# Patient Record
Sex: Female | Born: 1985 | Hispanic: Yes | Marital: Married | Smoking: Never smoker
Health system: Southern US, Community
[De-identification: ages and names within clinical notes are randomized; demographics above are authoritative.]

## PROBLEM LIST (undated history)

## (undated) ENCOUNTER — Inpatient Hospital Stay (HOSPITAL_COMMUNITY): Payer: Self-pay

## (undated) DIAGNOSIS — O009 Unspecified ectopic pregnancy without intrauterine pregnancy: Secondary | ICD-10-CM

## (undated) DIAGNOSIS — Z789 Other specified health status: Secondary | ICD-10-CM

## (undated) HISTORY — PX: NO PAST SURGERIES: SHX2092

---

## 2008-01-16 ENCOUNTER — Ambulatory Visit: Payer: Self-pay | Admitting: Family Medicine

## 2008-01-16 DIAGNOSIS — N946 Dysmenorrhea, unspecified: Secondary | ICD-10-CM

## 2008-01-16 DIAGNOSIS — F411 Generalized anxiety disorder: Secondary | ICD-10-CM | POA: Insufficient documentation

## 2008-01-16 LAB — CONVERTED CEMR LAB: Beta hcg, urine, semiquantitative: NEGATIVE

## 2008-03-05 ENCOUNTER — Ambulatory Visit: Payer: Self-pay | Admitting: Family Medicine

## 2008-03-05 DIAGNOSIS — R5381 Other malaise: Secondary | ICD-10-CM

## 2008-03-05 DIAGNOSIS — R5383 Other fatigue: Secondary | ICD-10-CM

## 2008-03-05 LAB — CONVERTED CEMR LAB
Calcium: 9.1 mg/dL (ref 8.4–10.5)
Chlamydia, Swab/Urine, PCR: NEGATIVE
Chloride: 106 meq/L (ref 96–112)
Cholesterol: 130 mg/dL (ref 0–200)
Creatinine, Ser: 0.56 mg/dL (ref 0.40–1.20)
GC Probe Amp, Urine: NEGATIVE
MCHC: 32.5 g/dL (ref 30.0–36.0)
VLDL: 17 mg/dL (ref 0–40)
WBC: 6.1 10*3/uL (ref 4.0–10.5)

## 2009-02-11 ENCOUNTER — Emergency Department (HOSPITAL_COMMUNITY): Admission: EM | Admit: 2009-02-11 | Discharge: 2009-02-11 | Payer: Self-pay | Admitting: Emergency Medicine

## 2009-02-11 ENCOUNTER — Emergency Department (HOSPITAL_COMMUNITY): Admission: EM | Admit: 2009-02-11 | Discharge: 2009-02-11 | Payer: Self-pay | Admitting: Family Medicine

## 2009-05-04 ENCOUNTER — Telehealth: Payer: Self-pay | Admitting: Family Medicine

## 2009-05-04 ENCOUNTER — Encounter: Payer: Self-pay | Admitting: Family Medicine

## 2009-05-04 ENCOUNTER — Ambulatory Visit: Payer: Self-pay | Admitting: Family Medicine

## 2009-05-04 DIAGNOSIS — M549 Dorsalgia, unspecified: Secondary | ICD-10-CM | POA: Insufficient documentation

## 2009-06-05 ENCOUNTER — Emergency Department (HOSPITAL_COMMUNITY): Admission: EM | Admit: 2009-06-05 | Discharge: 2009-06-05 | Payer: Self-pay | Admitting: Emergency Medicine

## 2010-01-03 ENCOUNTER — Encounter: Payer: Self-pay | Admitting: Family Medicine

## 2010-01-03 ENCOUNTER — Ambulatory Visit: Payer: Self-pay | Admitting: Family Medicine

## 2010-01-03 DIAGNOSIS — R42 Dizziness and giddiness: Secondary | ICD-10-CM

## 2010-04-19 NOTE — Letter (Signed)
Summary: Out of Work  [facility]  [address]   [place], [place] [address]   Phone: [phone]  Fax: [phone]    January 03, 2010   Employee:  Karianne ESCOBAR    To Whom It May Concern:   For Medical reasons, please excuse the above named employee from work for the following dates:  Start:   January 03, 2010  End:   May return to work on Wed, January 05, 2010.   If you need additional information, please feel free to contact our office.         Sincerely,    Cat Ta MD

## 2010-04-19 NOTE — Letter (Signed)
Summary: Out of Work  [facility]  [address]   [place], [place] [address]   Phone: [phone]  Fax: [phone]    May 04, 2009   Employee:  Annaly ESCOBAR    To Whom It May Concern:   For Medical reasons, please excuse the above named employee from work for the following dates:  Start: 05/03/09  Back to work on 05/05/09    If you need additional information, please feel free to contact our office.         Sincerely,    Bobby Rumpf  MD

## 2010-04-19 NOTE — Assessment & Plan Note (Signed)
Summary: back pain/[place]   Vital Signs:  Patient profile:   25 year old female Weight:      105 pounds Temp:     98.5 degrees F oral Pulse rate:   76 / minute BP sitting:   103 / 62  (right arm) Cuff size:   regular  Vitals Entered By: Tessie Fass CMA (May 04, 2009 11:24 AM) CC: back pain x 1 day Is Patient Diabetic? No Pain Assessment Patient in pain? yes     Location: back Intensity: 9   CC:  back pain x 1 day.  History of Present Illness: 1) Back pain: x 2 months. Gradual onset. Pain feels like - "it hurts". Worse yesterday; had to stay home from work and her boss was very upset, so she is here today to be seen. 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 Pain is in lower back bilaterally and at bilateral shoulders at times. Intermittent, worse after work. Ibuprofen help sometimes. Denies bladder or bowel incontinence, trauma, numbness / tingling, shooting pains, leg weakness, dysuria.   Habits & Providers  Alcohol-Tobacco-Diet     Tobacco Status: never  Current Medications (verified): 1)  Sprintec 28 0.25-35 Mg-Mcg Tabs (Norgestimate-Eth Estradiol) .... Take 1 By Mouth Every Day To Help With Pain With Your Periods 2)  Flexeril 5 Mg Tabs (Cyclobenzaprine Hcl) .... One Tab At Night As Needed Back Pain (Instructions in Spanish) 3)  Ibuprofen 800 Mg Tabs (Ibuprofen) .... One Tab By Mouth Three Times A Day As Needed For Back Pain (Instructions in Spanish Please)  Allergies (verified): No Known Drug Allergies  Physical Exam  General:  Well-developed,well-nourished,in no acute distress; alert,appropriate and cooperative throughout examination Lungs:  Normal respiratory effort, chest expands symmetrically. Lungs are clear to auscultation, no crackles or wheezes. Heart:  Normal rate and regular rhythm. S1 and S2 normal without gallop, murmur, click, rub or other extra sounds. Abdomen:  soft, non tender, non distended, + BS, no CVA tenderness Msk:  full  ROM at lumbar spine with flexion (though pain at lumbar spine, SI joints with full flexion), extension, lateral bending, rotation. +ve FABER bilaterally w/ pain at SI joints -ve SLR bilaterally  normal gait no scoliosis  R trapezius w/ spasm; bilateral shoulder exam o/w normal    Pulses:  2+ pedal  Neurologic:  alert & oriented X3, strength normal in all extremities, sensation intact to light touch, sensation intact to pinprick, gait normal, and DTRs symmetrical and normal.     Impression & Recommendations:  Problem # 1:  BACK PAIN, CHRONIC, INTERMITTENT (ICD-724.5) Assessment New  Likely secondary to poor lifting and bending technique at work; overuse at work. Print out in Spanish regarding proper technique. Exercises provided. Flexeril + Ibuprofen as below. No red flags on history or exam. Follow up with PCP in 3-4 weeks.  Her updated medication list for this problem includes:    Flexeril 5 Mg Tabs (Cyclobenzaprine hcl) ..... One tab at night as needed back pain (instructions in spanish)    Ibuprofen 800 Mg Tabs (Ibuprofen) ..... One tab by mouth three times a day as needed for back pain (instructions in spanish please)  Orders: [facility]- Est Level  3 (51761)  Complete Medication List: 1)  Sprintec 28 0.25-35 Mg-mcg Tabs (Norgestimate-eth estradiol) .... Take 1 by mouth every day to help with pain with your periods 2)  Flexeril 5 Mg Tabs (Cyclobenzaprine hcl) .... One tab at night as needed back pain (instructions in spanish) 3)  Ibuprofen 800 Mg Tabs (Ibuprofen) .... One tab by mouth three times a day as needed for back pain (instructions in spanish please) Prescriptions: IBUPROFEN 800 MG TABS (IBUPROFEN) one tab by mouth three times a day as needed for back pain (instructions in spanish please)  #50 x 0   Entered and Authorized by:   Bobby Rumpf  MD   Signed by:   Bobby Rumpf  MD on 05/04/2009   Method used:   Electronically to        [facility] [address] (retail)        [phone]       [place], [place]  [address]       Ph: [phone]       Fax: [phone]   RxID:   [id number] FLEXERIL 5 MG TABS (CYCLOBENZAPRINE HCL) one tab at night as needed back pain (instructions in spanish)  #20 x 0   Entered and Authorized by:   Bobby Rumpf  MD   Signed by:   Bobby Rumpf  MD on 05/04/2009   Method used:   Electronically to        [facility] [address] (retail)       [phone]       [place], [place]  [address]       Ph: [phone]       Fax: [phone]   RxID:   [id number]

## 2010-04-19 NOTE — Progress Notes (Signed)
Summary: triage  Phone Note Call from Patient Call back at Home Phone [phone]   Caller: Patient Summary of Call: started having back pain yesterday - wants to come in today Initial call taken by: De Nurse,  May 04, 2009 9:58 AM  Follow-up for Phone Call        hx of back pain. usually takes tylenol or ibuprofen. it is not helping.  denies injury. will be here for 11am work in. aware of wait Follow-up by: Golden Circle RN,  May 04, 2009 10:04 AM

## 2010-04-19 NOTE — Assessment & Plan Note (Signed)
Summary: dizzy x 6 days/Kara Roach/[place]   Vital Signs:  Patient profile:   25 year old female Weight:      107 pounds Temp:     97.6 degrees F oral Pulse rate:   73 / minute BP sitting:   103 / 61 BP standing:   110 / 68  Serial Vital Signs/Assessments:  Time      Position  BP       Pulse  Resp  Temp     By 12:08 PM  Lying RA  110/72   84                    Cat Ta MD 12:09 PM  Sitting   106/68   78                    Cat Ta MD 12:10 PM  Standing  110/68                         Cat Ta MD  CC: dizziness and nausea x 6 days Is Patient Diabetic? No Pain Assessment Patient in pain? no        Primary Care Maliki Gignac:  Sarah Swaziland MD  CC:  dizziness and nausea x 6 days.  History of Present Illness: 25 y/o previously healthy female presents with Dizziness  Dizziness x 5 days.  + nausea.  vomited once on Sat.   Dizziness occurs with movement (when she bends over, when she turns her head, when she stands up from sitting position).  Feels like the room is spinning.   Worse when she first wakes up and stands up Never happened before Does not eat a lot, does not drink alot: this is normal for her (no new dehydration) Feels like she will past out, but has not no dyspnea no cough no fever no chills  LMP: last week  Sick contacts: none Hearing loss: no Light or noise does not make dizziness worse, does make HA worse. She has a history of headaches, but this does not feel like her usual headache.  She has been having HAs, which is relieved with Ibuprofen.   Cold-like symptoms 2 wks ago: for 3 days had rhionorrhea, sneezing, eye irriation, sore throat  Allergies (verified): No Known Drug Allergies  Review of Systems General:  Denies chills, fever, and loss of appetite. Eyes:  Denies blurring, double vision, eye irritation, and eye pain. ENT:  Denies decreased hearing, earache, hoarseness, nasal congestion, ringing in ears, and sore throat. Neuro:  Complains of headaches and  sensation of room spinning; denies brief paralysis, difficulty with concentration, disturbances in coordination, falling down, inability to speak, memory loss, numbness, poor balance, seizures, tingling, tremors, visual disturbances, and weakness.  Physical Exam  General:  Well-developed,well-nourished,in no acute distress; alert,appropriate and cooperative throughout examination. Vitals reviewed.  Head:  Normocephalic and atraumatic without obvious abnormalities. No apparent alopecia or balding. Eyes:  No corneal or conjunctival inflammation noted. EOMI. Perrla. Funduscopic exam benign, without hemorrhages, exudates or papilledema. Vision grossly normal. Ears:  External ear exam shows no significant lesions or deformities.  Otoscopic examination reveals clear canals, tympanic membranes are intact bilaterally without bulging, retraction, inflammation or discharge. Hearing is grossly normal bilaterally. Neck:  supple and full ROM.   Neurologic:  No cranial nerve deficits noted. Station and gait are normal. Plantar reflexes are down-going bilaterally. DTRs are symmetrical throughout. Sensory, motor and coordinative functions appear intact.alert & oriented X3,  cranial nerves II-XII intact, strength normal in all extremities, sensation intact to light touch, sensation intact to pinprick, gait normal, DTRs symmetrical and normal, finger-to-nose normal, and heel-to-shin normal.    When orthostatics were checked, changing position caused dizziness.  Additional Exam:  Gilberto Better:  +dizziness, no nyastagmus   Impression & Recommendations:  Problem # 1:  VERTIGO (ICD-780.4) Assessment New New dizziness with position change.  Neural exam negative.  No papilledema.  No hearing loss on gross exam.  Gait is stable.  Dix Hallpike without nyastagmus.  Will treat with Meclizine and phenergan for now.  Pt to rtc in 1 wk.  If symptoms not resolved with Meclizine, will consider MRI, audiometry testing, Vestibular  clinic.    Her updated medication list for this problem includes:    Meclizine Hcl 25 Mg Tabs (Meclizine hcl) .Marland Kitchen... 1 tab by mouth two times a day to three times a day as needed dizziness    Promethazine Hcl 25 Mg Tabs (Promethazine hcl) .Marland Kitchen... 1 tab by mouth every 8 hours as needed vomiting/nausea  Orders: [facility]- Est Level  3 (99213)  Complete Medication List: 1)  Sprintec 28 0.25-35 Mg-mcg Tabs (Norgestimate-eth estradiol) .... Take 1 by mouth every day to help with pain with your periods 2)  Flexeril 5 Mg Tabs (Cyclobenzaprine hcl) .... One tab at night as needed back pain (instructions in spanish) 3)  Ibuprofen 800 Mg Tabs (Ibuprofen) .... One tab by mouth three times a day as needed for back pain (instructions in spanish please) 4)  Meclizine Hcl 25 Mg Tabs (Meclizine hcl) .Marland Kitchen.. 1 tab by mouth two times a day to three times a day as needed dizziness 5)  Promethazine Hcl 25 Mg Tabs (Promethazine hcl) .Marland Kitchen.. 1 tab by mouth every 8 hours as needed vomiting/nausea  Patient Instructions: 1)  Please schedule a follow-up appointment in 1 week. 2)  I think that your dizziness may be due to Vertigo.  I've given you a handout on this. 3)  For your dizziness: Meclizine 25mg  two times a day or three times a day 4)  In case you have vomiting: Phenergan 25mg  every 8 hours as needed   Prescriptions: PROMETHAZINE HCL 25 MG TABS (PROMETHAZINE HCL) 1 tab by mouth every 8 hours as needed vomiting/nausea  #30 x 1   Entered and Authorized by:   Angeline Slim MD   Signed by:   Angeline Slim MD on 01/03/2010   Method used:   Electronically to        [id number] (retail)       [address]       [place], [place]  [address]       Ph: [phone]       Fax: [phone]   RxID:   [id number] MECLIZINE HCL 25 MG TABS (MECLIZINE HCL) 1 tab by mouth two times a day to three times a day as needed dizziness  #40 x 1   Entered and Authorized by:   Angeline Slim MD   Signed by:    Angeline Slim MD on 01/03/2010   Method used:   Electronically to        [id number] (retail)       [address]       [place], [place]  [address]       Ph: [phone]  Fax: [phone]   RxID:   [id number]    Orders Added: 1)  [facility]- Est Level  3 [84696]

## 2010-06-22 LAB — POCT I-STAT, CHEM 8
HCT: 41 % (ref 36.0–46.0)
Hemoglobin: 13.9 g/dL (ref 12.0–15.0)
Sodium: 139 mEq/L (ref 135–145)
TCO2: 22 mmol/L (ref 0–100)

## 2010-06-22 LAB — DIFFERENTIAL
Basophils Absolute: 0 10*3/uL (ref 0.0–0.1)
Basophils Relative: 0 % (ref 0–1)
Eosinophils Absolute: 0 10*3/uL (ref 0.0–0.7)
Lymphocytes Relative: 9 % — ABNORMAL LOW (ref 12–46)
Lymphs Abs: 1.2 10*3/uL (ref 0.7–4.0)
Monocytes Absolute: 0.6 10*3/uL (ref 0.1–1.0)
Monocytes Relative: 5 % (ref 3–12)
Neutro Abs: 11.4 10*3/uL — ABNORMAL HIGH (ref 1.7–7.7)

## 2010-06-22 LAB — POCT URINALYSIS DIP (DEVICE)
Bilirubin Urine: NEGATIVE
Glucose, UA: NEGATIVE mg/dL
Nitrite: NEGATIVE

## 2010-06-22 LAB — CBC
Hemoglobin: 12.8 g/dL (ref 12.0–15.0)
MCHC: 33.9 g/dL (ref 30.0–36.0)
RDW: 12.9 % (ref 11.5–15.5)
WBC: 13.2 10*3/uL — ABNORMAL HIGH (ref 4.0–10.5)

## 2010-06-22 LAB — POCT PREGNANCY, URINE: Preg Test, Ur: NEGATIVE

## 2010-11-11 ENCOUNTER — Other Ambulatory Visit: Payer: Self-pay | Admitting: Obstetrics and Gynecology

## 2010-11-11 ENCOUNTER — Other Ambulatory Visit (HOSPITAL_COMMUNITY)
Admission: RE | Admit: 2010-11-11 | Discharge: 2010-11-11 | Disposition: A | Discharge status: Home / Self Care | Payer: Self-pay | Source: Ambulatory Visit | Attending: Obstetrics and Gynecology | Admitting: Obstetrics and Gynecology

## 2010-11-11 DIAGNOSIS — Z01419 Encounter for gynecological examination (general) (routine) without abnormal findings: Secondary | ICD-10-CM | POA: Insufficient documentation

## 2010-11-11 DIAGNOSIS — Z113 Encounter for screening for infections with a predominantly sexual mode of transmission: Secondary | ICD-10-CM | POA: Insufficient documentation

## 2010-11-11 DIAGNOSIS — Z1159 Encounter for screening for other viral diseases: Secondary | ICD-10-CM | POA: Insufficient documentation

## 2010-11-26 ENCOUNTER — Encounter (HOSPITAL_COMMUNITY): Payer: Self-pay | Admitting: *Deleted

## 2010-11-26 ENCOUNTER — Inpatient Hospital Stay (HOSPITAL_COMMUNITY)
Admission: AD | Admit: 2010-11-26 | Discharge: 2010-11-26 | Disposition: A | Discharge status: Home / Self Care | Payer: Medicaid Other | Source: Ambulatory Visit | Attending: Obstetrics and Gynecology | Admitting: Obstetrics and Gynecology

## 2010-11-26 ENCOUNTER — Other Ambulatory Visit: Payer: Self-pay | Admitting: Obstetrics and Gynecology

## 2010-11-26 ENCOUNTER — Inpatient Hospital Stay (HOSPITAL_COMMUNITY): Payer: Medicaid Other

## 2010-11-26 DIAGNOSIS — O039 Complete or unspecified spontaneous abortion without complication: Secondary | ICD-10-CM | POA: Insufficient documentation

## 2010-11-26 DIAGNOSIS — O4690 Antepartum hemorrhage, unspecified, unspecified trimester: Secondary | ICD-10-CM

## 2010-11-26 LAB — CBC
Hemoglobin: 12.7 g/dL (ref 12.0–15.0)
MCV: 81.6 fL (ref 78.0–100.0)
RBC: 4.68 MIL/uL (ref 3.87–5.11)
WBC: 14.2 10*3/uL — ABNORMAL HIGH (ref 4.0–10.5)

## 2010-11-26 MED ORDER — PROMETHAZINE HCL 25 MG PO TABS: 25.0000 mg | ORAL_TABLET | Freq: Three times a day (TID) | ORAL | Status: DC | PRN

## 2010-11-26 MED ORDER — OXYCODONE-ACETAMINOPHEN 10-325 MG PO TABS: 1.0000 | ORAL_TABLET | Freq: Four times a day (QID) | ORAL | Status: AC | PRN

## 2010-11-26 MED ORDER — OXYCODONE-ACETAMINOPHEN 5-325 MG PO TABS
2.0000 | ORAL_TABLET | Freq: Once | ORAL | Status: AC
  Administered 2010-11-26: 2 via ORAL
  Filled 2010-11-26: qty 2

## 2010-11-26 NOTE — Progress Notes (Signed)
Pt states, " I started spotting this morning, and two - three hours ago it got heavier and it is in my pants. I started cramping at 5 pm, and vomited many times, and had diarrhea four times since 8 pm."

## 2010-11-26 NOTE — ED Provider Notes (Signed)
Chief Complaint:  Vaginal Bleeding, Abdominal Cramping and Morning Sickness   Kara Roach is  25 y.o. G1P0.  [redacted]w[redacted]d  She presents complaining of Vaginal Bleeding, Abdominal Cramping and Morning Sickness Reports spotting began at 0730 am Friday, went to office and Dr. Neva Roach performed US but said it was "too early" to see pregnancy. States heavy bleeding and pain started at ~ 1700. Reports N/V/D this evening.  Obstetrical/Gynecological History: OB History    Grav Para Term Preterm Abortions TAB SAB Ect Mult Living   1               Past Medical History: No past medical history on file.  Past Surgical History: No past surgical history on file.  Family History: No family history on file.  Social History: History  Substance Use Topics  . Smoking status: Not on file  . Smokeless tobacco: Not on file  . Alcohol Use: Not on file    Allergies: No Known Allergies  Prescriptions prior to admission  Medication Sig Dispense Refill  . methyl salicylate-menthol (BENGAY) ointment Apply 1 application topically as needed. For pain       . prenatal vitamin w/FE, FA (PRENATAL 1 + 1) 27-1 MG TABS Take 1 tablet by mouth daily.        . cyclobenzaprine (FLEXERIL) 5 MG tablet Take 5 mg by mouth. Take at night as needed for back pain.  (instructions in spanish)       . ibuprofen (ADVIL,MOTRIN) 800 MG tablet Take 800 mg by mouth 3 (three) times daily as needed. For back pain.  (instructions in spanish please.)       . meclizine (ANTIVERT) 25 MG tablet Take 25 mg by mouth. Take 2 to 3 times a day as needed for dizziness.       . norgestimate-ethinyl estradiol (SPRINTEC 28) 0.25-35 MG-MCG per tablet Take 1 tablet by mouth daily. To help with pain with your periods.       . promethazine (PHENERGAN) 25 MG tablet Take 25 mg by mouth every 8 (eight) hours as needed. For vomiting/nausea.         Review of Systems - Negative except what has been reviewed in the HPI  Physical Exam   Blood pressure  99/50, pulse 67, temperature 98.3 F (36.8 C), temperature source Oral, resp. rate 20, height 5' (1.524 m), weight 47.741 kg (105 lb 4 oz).  General: General appearance - alert, well appearing, and in no distress, normal appearing weight, anxious and uncomfortable Mental status - alert, oriented to person, place, and time, anxious Abdomen - tenderness noted diffuse Focused Gynecological Exam: tissue noted on perineum, mod blood in vault, tissue removed from os. Uterus non tender, adnexa non enlarged, nontender  MD Consult: Discussed pt with Dr. Neva Roach who confirmed visualize gest sac in office on Friday, orders received for repeat US and pain meds.   Labs: Recent Results (from the past 24 hour(s))  CBC   Collection Time   12-15-2010  1:38 AM      Component Value Range   WBC 14.2 (*) 4.0 - 10.5 (K/uL)   RBC 4.68  3.87 - 5.11 (MIL/uL)   Hemoglobin 12.7  12.0 - 15.0 (g/dL)   HCT 40.9  81.1 - 91.4 (%)   MCV 81.6  78.0 - 100.0 (fL)   MCH 27.1  26.0 - 34.0 (pg)   MCHC 33.2  30.0 - 36.0 (g/dL)   RDW 78.2  95.6 - 21.3 (%)   Platelets 223  150 - 400 (K/uL)   Imaging Studies:  *RADIOLOGY REPORT*  Clinical Data: Pain. Bleeding. Gestational sac seen in office  yesterday.  OBSTETRIC <14 WK [place] AND TRANSVAGINAL OB US  Technique: Both transabdominal and transvaginal ultrasound  examinations were performed for complete evaluation of the  gestation as well as the maternal uterus, adnexal regions, and  pelvic cul-de-sac. Transvaginal technique was performed to assess  early pregnancy.  Comparison: None available.  Intrauterine pregnancy not visualized.  Endometrial thickness 1.4 cm. Considerations include; spontaneous  abortion, early pregnancy or ectopic pregnancy. Clinical  correlation, laboratory correlation and follow-up ultrasound  recommended for further delineation.  Ovaries appear unremarkable without adnexal mass or free fluid  noted.  IMPRESSION:  Intrauterine pregnancy not  visualized. Follow up as discussed  above.  Report to be called by Kara Roach, ultrasound technologist  Original Report Authenticated By: Kara Roach, Kara Roach.   Assessment: Complete SAB Patient Active Problem List  Diagnoses  . ANXIETY, MILD  . DYSMENORRHEA  . BACK PAIN, CHRONIC, INTERMITTENT  . VERTIGO  . FATIGUE    Plan: Discharge home with bleeding precautions Follow up with Dr. Neva Roach next week, call Monday for an appt  Kara Roach E. 12-05-2010,2:50 AM

## 2010-12-19 DEATH — deceased

## 2011-10-06 ENCOUNTER — Other Ambulatory Visit: Payer: Self-pay | Admitting: Infectious Diseases

## 2011-10-06 ENCOUNTER — Ambulatory Visit
Admission: RE | Admit: 2011-10-06 | Discharge: 2011-10-06 | Disposition: A | Discharge status: Home / Self Care | Payer: No Typology Code available for payment source | Source: Ambulatory Visit | Attending: Infectious Diseases | Admitting: Infectious Diseases

## 2011-10-06 DIAGNOSIS — R7611 Nonspecific reaction to tuberculin skin test without active tuberculosis: Secondary | ICD-10-CM

## 2011-12-02 ENCOUNTER — Inpatient Hospital Stay (HOSPITAL_COMMUNITY): Admission: RE | Admit: 2011-12-02 | Payer: Self-pay | Source: Ambulatory Visit

## 2011-12-02 ENCOUNTER — Ambulatory Visit (INDEPENDENT_AMBULATORY_CARE_PROVIDER_SITE_OTHER): Payer: BC Managed Care – PPO | Admitting: Emergency Medicine

## 2011-12-02 VITALS — BP 90/72 | HR 81 | Temp 98.5°F | Resp 18 | Ht 61.0 in | Wt 110.0 lb

## 2011-12-02 DIAGNOSIS — N949 Unspecified condition associated with female genital organs and menstrual cycle: Secondary | ICD-10-CM

## 2011-12-02 DIAGNOSIS — F410 Panic disorder [episodic paroxysmal anxiety] without agoraphobia: Secondary | ICD-10-CM

## 2011-12-02 DIAGNOSIS — R109 Unspecified abdominal pain: Secondary | ICD-10-CM

## 2011-12-02 DIAGNOSIS — R102 Pelvic and perineal pain: Secondary | ICD-10-CM

## 2011-12-02 LAB — POCT URINALYSIS DIPSTICK
Bilirubin, UA: NEGATIVE
Blood, UA: NEGATIVE
Nitrite, UA: NEGATIVE
Protein, UA: NEGATIVE
Urobilinogen, UA: 0.2
pH, UA: 7.5

## 2011-12-02 LAB — POCT UA - MICROSCOPIC ONLY: Crystals, Ur, HPF, POC: NEGATIVE

## 2011-12-02 LAB — POCT WET PREP WITH KOH: Yeast Wet Prep HPF POC: NEGATIVE

## 2011-12-02 MED ORDER — NAPROXEN 500 MG PO TABS: 500.0000 mg | ORAL_TABLET | Freq: Two times a day (BID) | ORAL | Status: DC

## 2011-12-02 NOTE — Progress Notes (Signed)
Date:  12/02/2011   Name:  Kara Roach   DOB:  1985-09-26   MRN:  [id number] Gender: female Age: 26 y.o.  PCP:  Burman Blacksmith., MD    Chief Complaint: Abdominal Pain and Shortness of Breath   History of Present Illness:  Kara Roach is a 26 y.o. pleasant patient who presents with the following:  To office with concerns about cramping abdominal pain.   She has no discharge or bleeding and her last period was two weeks ago although she says there is no possibility that she is pregnant.  No dysuria or urgency or frequency.  No fever or chills, nausea or vomiting.  G1P0A1.   Says that she was in the ER for a miscarriage about one year ago.  Ultrasound done at that time were reviewed.  Says that she has chest tightness and a history of anxiety.  Associated with difficulty breathing and a lot of worries.  No personal history of CAD or valve disease or pulmonary disease.  Pain not radiating and no associated symptoms.  Passes after short period but she has experienced these pains and shortness of breath for 3-4 years.    Patient Active Problem List  Diagnosis  . ANXIETY, MILD  . DYSMENORRHEA  . BACK PAIN, CHRONIC, INTERMITTENT  . VERTIGO  . FATIGUE    No past medical history on file.  No past surgical history on file.  History  Substance Use Topics  . Smoking status: Never Smoker   . Smokeless tobacco: Not on file  . Alcohol Use: Not on file    No family history on file.  Allergies  Allergen Reactions  . Penicillins     Medication list has been reviewed and updated.  Outpatient Prescriptions Prior to Visit  Medication Sig Dispense Refill  . methyl salicylate-menthol (BENGAY) ointment Apply 1 application topically as needed. For pain       . prenatal vitamin w/FE, FA (PRENATAL 1 + 1) 27-1 MG TABS Take 1 tablet by mouth daily.        . promethazine (PHENERGAN) 25 MG tablet Take 1 tablet (25 mg total) by mouth every 8 (eight) hours as needed. For  vomiting/nausea.  30 tablet  0    Review of Systems:  As per HPI, otherwise negative.    Physical Examination: Filed Vitals:   12/02/11 1305  BP: 90/72  Pulse: 81  Temp: 98.5 F (36.9 C)  Resp: 18   Filed Vitals:   12/02/11 1305  Height: 5\' 1"  (1.549 m)  Weight: 110 lb (49.896 kg)   Body mass index is 20.78 kg/(m^2). Ideal Body Weight: Weight in (lb) to have BMI = 25: 132   GEN: WDWN, NAD, Non-toxic, A & O x 3 HEENT: Atraumatic, Normocephalic. Neck supple. No masses, No LAD. Ears and Nose: No external deformity. CV: RRR, No M/G/R. No JVD. No thrill. No extra heart sounds. PULM: CTA B, no wheezes, crackles, rhonchi. No retractions. No resp. distress. No accessory muscle use. ABD: S, NT, ND, +BS. No rebound. No HSM. EXTR: No c/c/e NEURO Normal gait.  PSYCH: Normally interactive. Conversant. Not depressed or anxious appearing.  Calm demeanor. ;l  Assessment and Plan:  Abdominal pain BV   Carmelina Dane, MD 2  Results for orders placed in visit on 12/02/11  GC/CHLAMYDIA PROBE AMP, GENITAL      Result Value Range   Chlamydia, DNA Probe NEGATIVE     GC Probe Amp, Genital NEGATIVE    POCT URINE PREGNANCY  Result Value Range   Preg Test, Ur Negative    POCT URINALYSIS DIPSTICK      Result Value Range   Color, UA yellow     Clarity, UA cloudy     Glucose, UA neg     Bilirubin, UA neg     Ketones, UA neg     Spec Grav, UA 1.020     Blood, UA neg     pH, UA 7.5     Protein, UA neg     Urobilinogen, UA 0.2     Nitrite, UA neg     Leukocytes, UA Trace    POCT UA - MICROSCOPIC ONLY      Result Value Range   WBC, Ur, HPF, POC 0-2     RBC, urine, microscopic 0-1     Bacteria, U Microscopic 2+     Mucus, UA small     Epithelial cells, urine per micros 4-8     Crystals, Ur, HPF, POC neg     Casts, Ur, LPF, POC neg     Yeast, UA neg     Amorphous moderate    POCT WET PREP WITH KOH      Result Value Range   Trichomonas, UA Negative     Clue Cells  Wet Prep HPF POC 1-3     Epithelial Wet Prep HPF POC 3-10     Yeast Wet Prep HPF POC neg     Bacteria Wet Prep HPF POC 3+     RBC Wet Prep HPF POC 0-2     WBC Wet Prep HPF POC 5-7     KOH Prep POC Negative

## 2011-12-02 NOTE — Progress Notes (Signed)
  Subjective:    Patient ID: Kara Roach, female    DOB: 1985-05-06, 26 y.o.   MRN: [id number]  HPI    Review of Systems     Objective:   Physical Exam  Pelvic Exam-patient was very reluctant for me to examine her.  Slowly advanced speculum. Only superior half of cervix exposed. gen probe and wet prep taken. Neg chandelier sign.  Bimanual exam reveals fullness and slight tenderness in LLQ, but overall unremarkable.  Results for orders placed in visit on 12/02/11  POCT URINE PREGNANCY      Component Value Range   Preg Test, Ur Negative    POCT URINALYSIS DIPSTICK      Component Value Range   Color, UA yellow     Clarity, UA cloudy     Glucose, UA neg     Bilirubin, UA neg     Ketones, UA neg     Spec Grav, UA 1.020     Blood, UA neg     pH, UA 7.5     Protein, UA neg     Urobilinogen, UA 0.2     Nitrite, UA neg     Leukocytes, UA Trace    POCT UA - MICROSCOPIC ONLY      Component Value Range   WBC, Ur, HPF, POC 0-2     RBC, urine, microscopic 0-1     Bacteria, U Microscopic 2+     Mucus, UA small     Epithelial cells, urine per micros 4-8     Crystals, Ur, HPF, POC neg     Casts, Ur, LPF, POC neg     Yeast, UA neg     Amorphous moderate    POCT WET PREP WITH KOH      Component Value Range   Trichomonas, UA Negative     Clue Cells Wet Prep HPF POC 1-3     Epithelial Wet Prep HPF POC 3-10     Yeast Wet Prep HPF POC neg     Bacteria Wet Prep HPF POC 3+     RBC Wet Prep HPF POC 0-2     WBC Wet Prep HPF POC 5-7     KOH Prep POC Negative      Dr. Dareen Piano transferred patient care to me at end of his shift, so I can receive the U/S results.    Assessment & Plan:  [facility]el[facility] gen probe sent.

## 2011-12-04 ENCOUNTER — Ambulatory Visit (HOSPITAL_COMMUNITY)
Admission: RE | Admit: 2011-12-04 | Discharge: 2011-12-04 | Disposition: A | Discharge status: Home / Self Care | Payer: BC Managed Care – PPO | Source: Ambulatory Visit | Attending: Emergency Medicine | Admitting: Emergency Medicine

## 2011-12-04 DIAGNOSIS — N949 Unspecified condition associated with female genital organs and menstrual cycle: Secondary | ICD-10-CM | POA: Insufficient documentation

## 2011-12-04 DIAGNOSIS — R109 Unspecified abdominal pain: Secondary | ICD-10-CM

## 2011-12-04 DIAGNOSIS — R102 Pelvic and perineal pain: Secondary | ICD-10-CM

## 2011-12-04 DIAGNOSIS — N9489 Other specified conditions associated with female genital organs and menstrual cycle: Secondary | ICD-10-CM | POA: Insufficient documentation

## 2011-12-04 DIAGNOSIS — N83209 Unspecified ovarian cyst, unspecified side: Secondary | ICD-10-CM | POA: Insufficient documentation

## 2011-12-04 LAB — GC/CHLAMYDIA PROBE AMP, GENITAL: GC Probe Amp, Genital: NEGATIVE

## 2012-01-10 ENCOUNTER — Inpatient Hospital Stay (HOSPITAL_COMMUNITY)
Admission: AD | Admit: 2012-01-10 | Discharge: 2012-01-10 | Disposition: A | Discharge status: Home / Self Care | Payer: BC Managed Care – PPO | Source: Ambulatory Visit | Attending: Obstetrics | Admitting: Obstetrics

## 2012-01-10 ENCOUNTER — Inpatient Hospital Stay (HOSPITAL_COMMUNITY): Payer: BC Managed Care – PPO

## 2012-01-10 ENCOUNTER — Encounter (HOSPITAL_COMMUNITY): Payer: Self-pay | Admitting: *Deleted

## 2012-01-10 DIAGNOSIS — R079 Chest pain, unspecified: Secondary | ICD-10-CM | POA: Insufficient documentation

## 2012-01-10 DIAGNOSIS — O219 Vomiting of pregnancy, unspecified: Secondary | ICD-10-CM

## 2012-01-10 DIAGNOSIS — O210 Mild hyperemesis gravidarum: Secondary | ICD-10-CM | POA: Insufficient documentation

## 2012-01-10 DIAGNOSIS — R0789 Other chest pain: Secondary | ICD-10-CM

## 2012-01-10 DIAGNOSIS — O99891 Other specified diseases and conditions complicating pregnancy: Secondary | ICD-10-CM | POA: Insufficient documentation

## 2012-01-10 LAB — COMPREHENSIVE METABOLIC PANEL
ALT: 13 U/L (ref 0–35)
Albumin: 3.7 g/dL (ref 3.5–5.2)
Alkaline Phosphatase: 65 U/L (ref 39–117)
Potassium: 4.3 mEq/L (ref 3.5–5.1)
Sodium: 135 mEq/L (ref 135–145)
Total Protein: 6.9 g/dL (ref 6.0–8.3)

## 2012-01-10 LAB — URINALYSIS, ROUTINE W REFLEX MICROSCOPIC
Glucose, UA: NEGATIVE mg/dL
Ketones, ur: NEGATIVE mg/dL
Protein, ur: NEGATIVE mg/dL
Urobilinogen, UA: 0.2 mg/dL (ref 0.0–1.0)

## 2012-01-10 LAB — CBC WITH DIFFERENTIAL/PLATELET
Basophils Absolute: 0 10*3/uL (ref 0.0–0.1)
HCT: 36.8 % (ref 36.0–46.0)
Lymphocytes Relative: 16 % (ref 12–46)
Lymphs Abs: 2 10*3/uL (ref 0.7–4.0)
Monocytes Absolute: 1.3 10*3/uL — ABNORMAL HIGH (ref 0.1–1.0)
Neutro Abs: 9.1 10*3/uL — ABNORMAL HIGH (ref 1.7–7.7)
Platelets: 265 10*3/uL (ref 150–400)
RBC: 4.65 MIL/uL (ref 3.87–5.11)
RDW: 13.8 % (ref 11.5–15.5)
WBC: 12.5 10*3/uL — ABNORMAL HIGH (ref 4.0–10.5)

## 2012-01-10 LAB — WET PREP, GENITAL: Yeast Wet Prep HPF POC: NONE SEEN

## 2012-01-10 LAB — URINE MICROSCOPIC-ADD ON

## 2012-01-10 LAB — HCG, QUANTITATIVE, PREGNANCY: hCG, Beta Chain, Quant, S: 103824 m[IU]/mL — ABNORMAL HIGH (ref <5)

## 2012-01-10 MED ORDER — LACTATED RINGERS IV BOLUS (SEPSIS)
1000.0000 mL | Freq: Once | INTRAVENOUS | Status: AC
  Administered 2012-01-10: 1000 mL via INTRAVENOUS

## 2012-01-10 MED ORDER — CYCLOBENZAPRINE HCL 10 MG PO TABS: 5.0000 mg | ORAL_TABLET | Freq: Once | ORAL | Status: DC

## 2012-01-10 MED ORDER — CYCLOBENZAPRINE HCL 10 MG PO TABS: 5.0000 mg | ORAL_TABLET | Freq: Two times a day (BID) | ORAL | Status: DC | PRN

## 2012-01-10 MED ORDER — FAMOTIDINE IN NACL 20-0.9 MG/50ML-% IV SOLN
20.0000 mg | Freq: Once | INTRAVENOUS | Status: AC
  Administered 2012-01-10: 20 mg via INTRAVENOUS
  Filled 2012-01-10: qty 50

## 2012-01-10 MED ORDER — ONDANSETRON HCL 4 MG/2ML IJ SOLN
4.0000 mg | Freq: Once | INTRAMUSCULAR | Status: AC
  Administered 2012-01-10: 4 mg via INTRAVENOUS
  Filled 2012-01-10: qty 2

## 2012-01-10 MED ORDER — GI COCKTAIL ~~LOC~~
30.0000 mL | Freq: Once | ORAL | Status: AC
  Administered 2012-01-10: 30 mL via ORAL
  Filled 2012-01-10: qty 30

## 2012-01-10 MED ORDER — IBUPROFEN 400 MG PO TABS
400.0000 mg | ORAL_TABLET | Freq: Once | ORAL | Status: AC
  Administered 2012-01-10: 400 mg via ORAL
  Filled 2012-01-10: qty 1

## 2012-01-10 NOTE — MAU Note (Signed)
Pt states she has nausea and pain in her chest but no vomiting-states she cannot sleep

## 2012-01-10 NOTE — MAU Provider Note (Signed)
History     CSN: [id number]  Arrival date and time: 01/10/12 0243   None     Chief Complaint  Patient presents with  . Morning Sickness   HPI Kara Roach is a 26 y.o. female @ [redacted]w[redacted]d gestation who presents to MAU with nausea. She has an appointment tomorrow with [facility]. The CNM for that group request that the MAU provider evaluate the patient because she has not had her first visit in their office. The patient states that the nausea and vomiting started 2 weeks ago. Tonight she began having epigastric pain. She rates the pain as 8/10. The history was provided by the patient.  OB History    Grav Para Term Preterm Abortions TAB SAB Ect Mult Living   2    1  1          No past medical history on file.  No past surgical history on file.  No family history on file.  History  Substance Use Topics  . Smoking status: Never Smoker   . Smokeless tobacco: Not on file  . Alcohol Use: Not on file    Allergies:  Allergies  Allergen Reactions  . Penicillins     Prescriptions prior to admission  Medication Sig Dispense Refill  . methyl salicylate-menthol (BENGAY) ointment Apply 1 application topically as needed. For pain       . naproxen (NAPROSYN) 500 MG tablet Take 1 tablet (500 mg total) by mouth 2 (two) times daily with a meal. Prn pain  60 tablet  0  . prenatal vitamin w/FE, FA (PRENATAL 1 + 1) 27-1 MG TABS Take 1 tablet by mouth daily.        . promethazine (PHENERGAN) 25 MG tablet Take 1 tablet (25 mg total) by mouth every 8 (eight) hours as needed. For vomiting/nausea.  30 tablet  0    Review of Systems  Constitutional: Negative for fever and chills.  Respiratory: Negative for cough and wheezing.   Cardiovascular: Positive for chest pain.  Gastrointestinal: Positive for heartburn, nausea, vomiting and abdominal pain.  Genitourinary: Positive for frequency.  Musculoskeletal: Negative for back pain.  Skin: Negative for rash.  Neurological: Negative for  dizziness, seizures and headaches.  Psychiatric/Behavioral: Negative for depression. The patient is not nervous/anxious.    Physical Exam   Blood pressure 114/50, pulse 84, temperature 98.2 F (36.8 C), temperature source Oral, resp. rate 20, height 5\' 1"  (1.549 m), weight 117 lb (53.071 kg), last menstrual period 11/18/2011, SpO2 100.00%.  Physical Exam  Nursing note and vitals reviewed. Constitutional: She is oriented to person, place, and time. She appears well-developed and well-nourished. No distress.  HENT:  Head: Normocephalic and atraumatic.  Eyes: EOM are normal.  Neck: Neck supple.  Cardiovascular: Normal rate and regular rhythm.   Respiratory: Effort normal and breath sounds normal. She exhibits no tenderness and no deformity.  GI: Soft. There is tenderness in the epigastric area. There is no rebound, no guarding and no CVA tenderness.  Genitourinary:       External genitalia without lesions. White discharge vaginal vault. Cervix long, closed, no CMT, no adnexal tenderness. Uterus slightly enlarged.  Musculoskeletal: Normal range of motion.  Neurological: She is alert and oriented to person, place, and time.  Skin: Skin is warm and dry.  Psychiatric: She has a normal mood and affect. Her behavior is normal. Judgment and thought content normal.   Results for orders placed during the hospital encounter of 01/10/12 (from the past 24 hour(s))  URINALYSIS, ROUTINE W REFLEX MICROSCOPIC     Status: Abnormal   Collection Time   01/10/12  3:34 AM      Component Value Range   Color, Urine YELLOW  YELLOW   APPearance CLEAR  CLEAR   Specific Gravity, Urine <1.005 (*) 1.005 - 1.030   pH 6.5  5.0 - 8.0   Glucose, UA NEGATIVE  NEGATIVE mg/dL   Hgb urine dipstick NEGATIVE  NEGATIVE   Bilirubin Urine NEGATIVE  NEGATIVE   Ketones, ur NEGATIVE  NEGATIVE mg/dL   Protein, ur NEGATIVE  NEGATIVE mg/dL   Urobilinogen, UA 0.2  0.0 - 1.0 mg/dL   Nitrite NEGATIVE  NEGATIVE   Leukocytes, UA  TRACE (*) NEGATIVE  URINE MICROSCOPIC-ADD ON     Status: Abnormal   Collection Time   01/10/12  3:34 AM      Component Value Range   Squamous Epithelial / LPF FEW (*) RARE   WBC, UA 0-2  <3 WBC/hpf   RBC / HPF 0-2  <3 RBC/hpf   Bacteria, UA FEW (*) RARE  POCT PREGNANCY, URINE     Status: Abnormal   Collection Time   01/10/12  3:43 AM      Component Value Range   Preg Test, Ur POSITIVE (*) NEGATIVE  CBC WITH DIFFERENTIAL     Status: Abnormal   Collection Time   01/10/12  4:05 AM      Component Value Range   WBC 12.5 (*) 4.0 - 10.5 K/uL   RBC 4.65  3.87 - 5.11 MIL/uL   Hemoglobin 12.3  12.0 - 15.0 g/dL   HCT 16.1  09.6 - 04.5 %   MCV 79.1  78.0 - 100.0 fL   MCH 26.5  26.0 - 34.0 pg   MCHC 33.4  30.0 - 36.0 g/dL   RDW 40.9  81.1 - 91.4 %   Platelets 265  150 - 400 K/uL   Neutrophils Relative 73  43 - 77 %   Neutro Abs 9.1 (*) 1.7 - 7.7 K/uL   Lymphocytes Relative 16  12 - 46 %   Lymphs Abs 2.0  0.7 - 4.0 K/uL   Monocytes Relative 10  3 - 12 %   Monocytes Absolute 1.3 (*) 0.1 - 1.0 K/uL   Eosinophils Relative 1  0 - 5 %   Eosinophils Absolute 0.1  0.0 - 0.7 K/uL   Basophils Relative 0  0 - 1 %   Basophils Absolute 0.0  0.0 - 0.1 K/uL  HCG, QUANTITATIVE, PREGNANCY     Status: Abnormal   Collection Time   01/10/12  4:05 AM      Component Value Range   hCG, Beta Francene Finders [id number] (*) <5 mIU/mL  ABO/RH     Status: Normal   Collection Time   01/10/12  4:05 AM      Component Value Range   ABO/RH(D) O POS    COMPREHENSIVE METABOLIC PANEL     Status: Abnormal   Collection Time   01/10/12  4:05 AM      Component Value Range   Sodium 135  135 - 145 mEq/L   Potassium 4.3  3.5 - 5.1 mEq/L   Chloride 101  96 - 112 mEq/L   CO2 22  19 - 32 mEq/L   Glucose, Bld 96  70 - 99 mg/dL   BUN 8  6 - 23 mg/dL   Creatinine, Ser 2.13 (*) 0.50 - 1.10 mg/dL   Calcium 9.8  8.4 -  10.5 mg/dL   Total Protein 6.9  6.0 - 8.3 g/dL   Albumin 3.7  3.5 - 5.2 g/dL   AST 18  0 - 37 U/L   ALT 13   0 - 35 U/L   Alkaline Phosphatase 65  39 - 117 U/L   Total Bilirubin 0.1 (*) 0.3 - 1.2 mg/dL   GFR calc non Af Amer >90  >90 mL/min   GFR calc Af Amer >90  >90 mL/min  WET PREP, GENITAL     Status: Abnormal   Collection Time   01/10/12  4:30 AM      Component Value Range   Yeast Wet Prep HPF POC NONE SEEN  NONE SEEN   Trich, Wet Prep NONE SEEN  NONE SEEN   Clue Cells Wet Prep HPF POC FEW (*) NONE SEEN   WBC, Wet Prep HPF POC FEW (*) NONE SEEN   US Ob Comp Less 14 Wks  01/10/2012  *RADIOLOGY REPORT*  Clinical Data: Pain, vomiting, pregnant.  OBSTETRIC <14 WK ULTRASOUND  Technique:  Transabdominal ultrasound was performed for evaluation of the gestation as well as the maternal uterus and adnexal regions.  Comparison:  None.  Intrauterine gestational sac: Visualized/normal in shape. Yolk sac: Identified Embryo: Identified Cardiac Activity: Identified Heart Rate: 161 bpm  CRL:  14 mm  7 w  5 d         [place] EDC: 08/23/2012  Maternal uterus/Adnexae: No subchorionic hemorrhage.  Normal sonographic appearance to the ovaries.  No free fluid.  IMPRESSION: Single intrauterine gestation with cardiac activity documented. Estimated age of 7 weeks 5 days by crown-rump length.   Original Report Authenticated By: Waneta Martins, M.D.   @ 05:00 patient returned from ultrasound and states the pain is a little better after IVF, Zofran and Pepcid. She rates the pain as 5/10 down from 9/10.  Using the Spanish translator I discussed with the patient her chest pain. She states it started tonight at work approximately 7 pm. She works as a Education officer, community rooms. She describes the pain as a pressure feeling . She has had nausea for several days but no vomiting. Food does not make the pain better or worse.   Based on the patient information we will get an EKG and give GI Cocktail.    Assessment: 26 y.o. female with IUP @  [redacted]w[redacted]d gestation    Chest pain   Nausea in early pregnancy  Plan:  GI  Cocktail   EKG   MAU Course: Discussed with Dr. Macon Large and will give Ibuprofen now and Rx for Flexeril. If her symptoms worsen go to ED.  Procedures  EKG: Rate 70 bpm, normal sinus rhythm,  Discussed with the patient and all questioned fully answered. She will go to the ED if problems arise.   Medication List     As of 01/15/2012 12:10 AM    START taking these medications         cyclobenzaprine 10 MG tablet   Commonly known as: FLEXERIL   Take 0.5 tablets (5 mg total) by mouth 2 (two) times daily as needed for muscle spasms.      CONTINUE taking these medications         methyl salicylate-menthol ointment      naproxen 500 MG tablet   Commonly known as: NAPROSYN   Take 1 tablet (500 mg total) by mouth 2 (two) times daily with a meal. Prn pain      prenatal vitamin w/FE,  FA 27-1 MG Tabs      promethazine 25 MG tablet   Commonly known as: PHENERGAN   Take 1 tablet (25 mg total) by mouth every 8 (eight) hours as needed. For vomiting/nausea.          Where to get your medications    These are the prescriptions that you need to pick up.   You may get these medications from any pharmacy.         cyclobenzaprine 10 MG tablet            NEESE,HOPE, RN, FNP, BC 01/10/2012, 5:25 AM

## 2012-01-11 LAB — OB RESULTS CONSOLE RPR: RPR: NONREACTIVE

## 2012-01-11 LAB — OB RESULTS CONSOLE HEPATITIS B SURFACE ANTIGEN: Hepatitis B Surface Ag: NEGATIVE

## 2012-01-11 LAB — OB RESULTS CONSOLE RUBELLA ANTIBODY, IGM: Rubella: IMMUNE

## 2012-01-11 LAB — OB RESULTS CONSOLE ABO/RH: RH Type: POSITIVE

## 2012-01-15 NOTE — MAU Provider Note (Signed)
Attestation of Attending Supervision of Advanced Practitioner (CNM/NP): Evaluation and management procedures were performed by the Advanced Practitioner under my supervision and collaboration.  I have reviewed the Advanced Practitioner's note and chart, and I agree with the management and plan.  Marnita Poirier, MD, FACOG Attending Obstetrician & Gynecologist Faculty Practice, [facility]  

## 2012-01-24 LAB — OB RESULTS CONSOLE GC/CHLAMYDIA: Chlamydia: NEGATIVE

## 2012-03-20 NOTE — L&D Delivery Note (Signed)
Delivery Note At 3:53 PM a healthy female was delivered via Vaginal, Vacuum (Extractor) (Presentation: Ant. Occiput  ).  APGAR: 8-9, ; weight 7 lb 9.9 oz (3455 g).   Placenta status: complete, 3Vs .  Cord:  with the following complications: none.  Cord pH: None  Anesthesia: Epidural  Episiotomy: none Lacerations: 2nd degree perineal Suture Repair: vicryl rapide Est. Blood Loss (mL):  350 cc  Mom to postpartum.  Baby to nursery-stable.  Kuulei Kleier,MARIE-LYNE 08/11/2012, 4:31 PM

## 2012-08-11 ENCOUNTER — Inpatient Hospital Stay (HOSPITAL_COMMUNITY): Payer: BC Managed Care – PPO | Admitting: Anesthesiology

## 2012-08-11 ENCOUNTER — Inpatient Hospital Stay (HOSPITAL_COMMUNITY)
Admission: AD | Admit: 2012-08-11 | Discharge: 2012-08-13 | DRG: 373 | Disposition: A | Discharge status: Home / Self Care | Payer: BC Managed Care – PPO | Source: Ambulatory Visit | Attending: Obstetrics & Gynecology | Admitting: Obstetrics & Gynecology

## 2012-08-11 ENCOUNTER — Encounter (HOSPITAL_COMMUNITY): Payer: Self-pay | Admitting: Anesthesiology

## 2012-08-11 ENCOUNTER — Encounter (HOSPITAL_COMMUNITY): Payer: Self-pay

## 2012-08-11 DIAGNOSIS — D62 Acute posthemorrhagic anemia: Secondary | ICD-10-CM | POA: Diagnosis not present

## 2012-08-11 DIAGNOSIS — O9903 Anemia complicating the puerperium: Secondary | ICD-10-CM | POA: Diagnosis not present

## 2012-08-11 HISTORY — DX: Other specified health status: Z78.9

## 2012-08-11 LAB — RPR: RPR Ser Ql: NONREACTIVE

## 2012-08-11 LAB — TYPE AND SCREEN: ABO/RH(D): O POS

## 2012-08-11 LAB — OB RESULTS CONSOLE GBS: GBS: NEGATIVE

## 2012-08-11 LAB — CBC
Hemoglobin: 11.5 g/dL — ABNORMAL LOW (ref 12.0–15.0)
MCH: 24.9 pg — ABNORMAL LOW (ref 26.0–34.0)
RBC: 4.61 MIL/uL (ref 3.87–5.11)

## 2012-08-11 MED ORDER — ONDANSETRON HCL 4 MG PO TABS: 4.0000 mg | ORAL_TABLET | ORAL | Status: DC | PRN

## 2012-08-11 MED ORDER — LACTATED RINGERS IV SOLN: 500.0000 mL | INTRAVENOUS | Status: DC | PRN

## 2012-08-11 MED ORDER — WITCH HAZEL-GLYCERIN EX PADS: 1.0000 "application " | MEDICATED_PAD | CUTANEOUS | Status: DC | PRN

## 2012-08-11 MED ORDER — DIBUCAINE 1 % RE OINT: 1.0000 "application " | TOPICAL_OINTMENT | RECTAL | Status: DC | PRN

## 2012-08-11 MED ORDER — SENNOSIDES-DOCUSATE SODIUM 8.6-50 MG PO TABS
2.0000 | ORAL_TABLET | Freq: Every day | ORAL | Status: DC
  Administered 2012-08-11 – 2012-08-12 (×2): 2 via ORAL

## 2012-08-11 MED ORDER — LACTATED RINGERS IV SOLN
INTRAVENOUS | Status: DC
  Administered 2012-08-11: 12:00:00 via INTRAVENOUS
  Administered 2012-08-11: 125 mL/h via INTRAVENOUS

## 2012-08-11 MED ORDER — PRENATAL MULTIVITAMIN CH
1.0000 | ORAL_TABLET | Freq: Every day | ORAL | Status: DC
  Administered 2012-08-12 – 2012-08-13 (×2): 1 via ORAL
  Filled 2012-08-11 (×2): qty 1

## 2012-08-11 MED ORDER — TETANUS-DIPHTH-ACELL PERTUSSIS 5-2.5-18.5 LF-MCG/0.5 IM SUSP: 0.5000 mL | Freq: Once | INTRAMUSCULAR | Status: DC

## 2012-08-11 MED ORDER — OXYCODONE-ACETAMINOPHEN 5-325 MG PO TABS
1.0000 | ORAL_TABLET | ORAL | Status: DC | PRN
  Administered 2012-08-13: 1 via ORAL
  Filled 2012-08-11: qty 1

## 2012-08-11 MED ORDER — OXYCODONE-ACETAMINOPHEN 5-325 MG PO TABS
1.0000 | ORAL_TABLET | ORAL | Status: DC | PRN
  Administered 2012-08-11: 1 via ORAL
  Filled 2012-08-11: qty 1

## 2012-08-11 MED ORDER — PHENYLEPHRINE 40 MCG/ML (10ML) SYRINGE FOR IV PUSH (FOR BLOOD PRESSURE SUPPORT)
80.0000 ug | PREFILLED_SYRINGE | INTRAVENOUS | Status: DC | PRN
  Filled 2012-08-11: qty 2

## 2012-08-11 MED ORDER — BENZOCAINE-MENTHOL 20-0.5 % EX AERO
1.0000 "application " | INHALATION_SPRAY | CUTANEOUS | Status: DC | PRN
  Filled 2012-08-11: qty 56

## 2012-08-11 MED ORDER — LANOLIN HYDROUS EX OINT: TOPICAL_OINTMENT | CUTANEOUS | Status: DC | PRN

## 2012-08-11 MED ORDER — DIPHENHYDRAMINE HCL 25 MG PO CAPS: 25.0000 mg | ORAL_CAPSULE | Freq: Four times a day (QID) | ORAL | Status: DC | PRN

## 2012-08-11 MED ORDER — ONDANSETRON HCL 4 MG/2ML IJ SOLN: 4.0000 mg | INTRAMUSCULAR | Status: DC | PRN

## 2012-08-11 MED ORDER — LIDOCAINE HCL (PF) 1 % IJ SOLN
30.0000 mL | INTRAMUSCULAR | Status: DC | PRN
  Filled 2012-08-11 (×2): qty 30

## 2012-08-11 MED ORDER — ZOLPIDEM TARTRATE 5 MG PO TABS: 5.0000 mg | ORAL_TABLET | Freq: Every evening | ORAL | Status: DC | PRN

## 2012-08-11 MED ORDER — OXYTOCIN 40 UNITS IN LACTATED RINGERS INFUSION - SIMPLE MED: 62.5000 mL/h | INTRAVENOUS | Status: DC

## 2012-08-11 MED ORDER — IBUPROFEN 600 MG PO TABS
600.0000 mg | ORAL_TABLET | Freq: Four times a day (QID) | ORAL | Status: DC
  Administered 2012-08-11 – 2012-08-13 (×7): 600 mg via ORAL
  Filled 2012-08-11 (×7): qty 1

## 2012-08-11 MED ORDER — OXYTOCIN BOLUS FROM INFUSION: 500.0000 mL | INTRAVENOUS | Status: DC

## 2012-08-11 MED ORDER — CITRIC ACID-SODIUM CITRATE 334-500 MG/5ML PO SOLN: 30.0000 mL | ORAL | Status: DC | PRN

## 2012-08-11 MED ORDER — OXYTOCIN 40 UNITS IN LACTATED RINGERS INFUSION - SIMPLE MED: 62.5000 mL/h | INTRAVENOUS | Status: DC | PRN

## 2012-08-11 MED ORDER — FENTANYL 2.5 MCG/ML BUPIVACAINE 1/10 % EPIDURAL INFUSION (WH - ANES)
14.0000 mL/h | INTRAMUSCULAR | Status: DC | PRN
  Administered 2012-08-11: 14 mL/h via EPIDURAL
  Filled 2012-08-11 (×2): qty 125

## 2012-08-11 MED ORDER — OXYTOCIN 10 UNIT/ML IJ SOLN: 10.0000 [IU] | Freq: Once | INTRAMUSCULAR | Status: AC

## 2012-08-11 MED ORDER — IBUPROFEN 600 MG PO TABS
600.0000 mg | ORAL_TABLET | Freq: Four times a day (QID) | ORAL | Status: DC | PRN
  Administered 2012-08-11: 600 mg via ORAL
  Filled 2012-08-11: qty 1

## 2012-08-11 MED ORDER — LIDOCAINE HCL (PF) 1 % IJ SOLN
INTRAMUSCULAR | Status: DC | PRN
  Administered 2012-08-11: 5 mL
  Administered 2012-08-11: 3 mL
  Administered 2012-08-11: 5 mL

## 2012-08-11 MED ORDER — SIMETHICONE 80 MG PO CHEW: 80.0000 mg | CHEWABLE_TABLET | ORAL | Status: DC | PRN

## 2012-08-11 MED ORDER — LACTATED RINGERS IV SOLN
500.0000 mL | Freq: Once | INTRAVENOUS | Status: AC
Start: 2012-08-11 — End: 2012-08-11
  Administered 2012-08-11: 500 mL via INTRAVENOUS

## 2012-08-11 MED ORDER — ACETAMINOPHEN 325 MG PO TABS: 650.0000 mg | ORAL_TABLET | ORAL | Status: DC | PRN

## 2012-08-11 MED ORDER — EPHEDRINE 5 MG/ML INJ
10.0000 mg | INTRAVENOUS | Status: DC | PRN
  Filled 2012-08-11: qty 4
  Filled 2012-08-11: qty 2

## 2012-08-11 MED ORDER — TERBUTALINE SULFATE 1 MG/ML IJ SOLN: 0.2500 mg | Freq: Once | INTRAMUSCULAR | Status: DC | PRN

## 2012-08-11 MED ORDER — OXYTOCIN 10 UNIT/ML IJ SOLN
INTRAMUSCULAR | Status: AC
  Administered 2012-08-11: 10 [IU] via INTRAMUSCULAR
  Filled 2012-08-11: qty 2

## 2012-08-11 MED ORDER — ONDANSETRON HCL 4 MG/2ML IJ SOLN
4.0000 mg | Freq: Four times a day (QID) | INTRAMUSCULAR | Status: DC | PRN
  Administered 2012-08-11: 4 mg via INTRAVENOUS
  Filled 2012-08-11: qty 2

## 2012-08-11 MED ORDER — OXYTOCIN 40 UNITS IN LACTATED RINGERS INFUSION - SIMPLE MED
1.0000 m[IU]/min | INTRAVENOUS | Status: DC
  Administered 2012-08-11: 3 m[IU]/min via INTRAVENOUS
  Administered 2012-08-11: 4 m[IU]/min via INTRAVENOUS
  Administered 2012-08-11: 2 m[IU]/min via INTRAVENOUS
  Filled 2012-08-11: qty 1000

## 2012-08-11 MED ORDER — DIPHENHYDRAMINE HCL 50 MG/ML IJ SOLN: 12.5000 mg | INTRAMUSCULAR | Status: DC | PRN

## 2012-08-11 MED ORDER — FLEET ENEMA 7-19 GM/118ML RE ENEM: 1.0000 | ENEMA | RECTAL | Status: DC | PRN

## 2012-08-11 MED ORDER — EPHEDRINE 5 MG/ML INJ
10.0000 mg | INTRAVENOUS | Status: DC | PRN
  Administered 2012-08-11: 10 mg via INTRAVENOUS
  Filled 2012-08-11: qty 2

## 2012-08-11 MED ORDER — FENTANYL 2.5 MCG/ML BUPIVACAINE 1/10 % EPIDURAL INFUSION (WH - ANES)
INTRAMUSCULAR | Status: DC | PRN
  Administered 2012-08-11: 14 mL/h via EPIDURAL

## 2012-08-11 MED ORDER — PHENYLEPHRINE 40 MCG/ML (10ML) SYRINGE FOR IV PUSH (FOR BLOOD PRESSURE SUPPORT)
80.0000 ug | PREFILLED_SYRINGE | INTRAVENOUS | Status: DC | PRN
  Filled 2012-08-11: qty 2
  Filled 2012-08-11: qty 5

## 2012-08-11 NOTE — Anesthesia Procedure Notes (Signed)
Epidural Patient location during procedure: OB  Staffing Anesthesiologist: Phillips Grout Performed by: anesthesiologist   Preanesthetic Checklist Completed: patient identified, site marked, surgical consent, pre-op evaluation, timeout performed, IV checked, risks and benefits discussed and monitors and equipment checked  Epidural Patient position: sitting Prep: ChloraPrep Patient monitoring: heart rate, continuous pulse ox and blood pressure Approach: right paramedian Injection technique: LOR saline  Needle:  Needle type: Tuohy  Needle gauge: 17 G Needle length: 9 cm and 9 Needle insertion depth: 8 cm Catheter type: closed end flexible Catheter size: 20 Guage Catheter at skin depth: 13 cm Test dose: negative  Assessment Events: blood not aspirated, injection not painful, no injection resistance, negative IV test and no paresthesia  Additional Notes   Patient tolerated the insertion well without complications.

## 2012-08-11 NOTE — MAU Note (Signed)
Water broke about 30 minutes ago, clear fluid.

## 2012-08-11 NOTE — H&P (Signed)
Subjective:  Kara Roach is a 27 y.o. G2 P0 female with EDC 08/21/12 at 61 and 4/[redacted] weeks gestation who is being admitted for labor management.  Her current obstetrical history is normal.  Patient reports AF leak.   Fetal Movement: normal.     Objective:   Vital signs in last 24 hours: Temp:  [98.1 F (36.7 C)-98.6 F (37 C)] 98.4 F (36.9 C) (05/25 0501) Pulse Rate:  [70-112] 101 (05/25 0631) Resp:  [14-22] 16 (05/25 0631) BP: (84-140)/(49-76) 125/56 mmHg (05/25 0631) SpO2:  [91 %-100 %] 99 % (05/25 0445) Weight:  [77.747 kg (171 lb 6.4 oz)] 77.747 kg (171 lb 6.4 oz) (05/25 0212)   General:   alert  Skin:   normal  HEENT:  PERRLA  Lungs:   clear to auscultation bilaterally  Heart:   regular rate and rhythm  Breasts:   Deferred  Abdomen:  Gravid  Pelvis:  Exam deferred.  FHT:  140's BPM, good variability, accelerations present, no deceleration.  Uterine Size: size equals dates  Presentations: cephalic  Cervix:    Dilation: 6 cm   Effacement: 100%   Station:  -1   Consistency: soft   Position: anterior                                                            AF clear Lab Review  Rh+  AFP:NML, Ultrascreen wnl.  One hour GTT: Normal   GBS neg.   Assessment/Plan:  38 and 4/[redacted] weeks gestation.  Active labor with SROM.  Obstetrical history is normal.    Risks, benefits, alternatives and possible complications have been discussed in detail with the patient.  Pre-admission, admission, and post admission procedures and expectations were discussed in detail.  All questions answered, all appropriate consents will be signed at the Hospital. Admission is planned for today.  Continue present management towards probable vaginal delivery.

## 2012-08-11 NOTE — Anesthesia Preprocedure Evaluation (Signed)

## 2012-08-12 DIAGNOSIS — D62 Acute posthemorrhagic anemia: Secondary | ICD-10-CM | POA: Diagnosis not present

## 2012-08-12 LAB — CBC
HCT: 28.9 % — ABNORMAL LOW (ref 36.0–46.0)
MCV: 76.5 fL — ABNORMAL LOW (ref 78.0–100.0)
RDW: 16.8 % — ABNORMAL HIGH (ref 11.5–15.5)
WBC: 17.6 10*3/uL — ABNORMAL HIGH (ref 4.0–10.5)

## 2012-08-12 MED ORDER — DOCUSATE SODIUM 100 MG PO CAPS
100.0000 mg | ORAL_CAPSULE | Freq: Every day | ORAL | Status: DC
  Administered 2012-08-12 – 2012-08-13 (×2): 100 mg via ORAL
  Filled 2012-08-12 (×2): qty 1

## 2012-08-12 MED ORDER — POLYSACCHARIDE IRON COMPLEX 150 MG PO CAPS
150.0000 mg | ORAL_CAPSULE | Freq: Every day | ORAL | Status: DC
  Administered 2012-08-12 – 2012-08-13 (×2): 150 mg via ORAL
  Filled 2012-08-12 (×3): qty 1

## 2012-08-12 NOTE — Progress Notes (Signed)
Mom concerned that baby might need a bottle. Explained baby can be heard gulping colostrum. Also explained the risks of bottle feeding to successful breast feeding.

## 2012-08-12 NOTE — Anesthesia Postprocedure Evaluation (Signed)
Anesthesia Post Note  Patient: Kara Roach  Procedure(s) Performed: * No procedures listed *  Anesthesia type: Epidural  Patient location: Mother/Baby  Post pain: Pain level controlled  Post assessment: Post-op Vital signs reviewed  Last Vitals:  Filed Vitals:   08/12/12 0552  BP: 130/76  Pulse: 98  Temp: 37.2 C  Resp: 20    Post vital signs: Reviewed  Level of consciousness: awake  Complications: No apparent anesthesia complications

## 2012-08-12 NOTE — Progress Notes (Signed)
PPD 1 SVD  S:  Reports feeling well - planning discharge in AM             Tolerating po/ No nausea or vomiting             Bleeding is light             Pain controlled with motrin and percocet             Up ad lib / ambulatory / voiding QS  Newborn breast feeding  O:               VS: BP 130/76  Pulse 98  Temp(Src) 98.9 F (37.2 C) (Oral)  Resp 20  Ht 5\' 1"  (1.549 m)  Wt 77.747 kg (171 lb 6.4 oz)  BMI 32.4 kg/m2  SpO2 98%  LMP 11/18/2011   LABS:  Recent Labs  08/11/12 0310 08/12/12 0605  WBC 11.2* 17.6*  HGB 11.5* 9.4*  PLT 228 193                                   I&O: Intake/Output     05/25 0701 - 05/26 0700 05/26 0701 - 05/27 0700   Urine (mL/kg/hr) 750 (0.4)    Blood 700 (0.4)    Total Output 1450     Net -1450                      Physical Exam:             Alert and oriented X3  Lungs: Clear and unlabored  Heart: regular rate and rhythm / no mumurs  Abdomen: soft, non-tender, non-distended              Fundus: firm, non-tender, Ueven  Perineum: mild edema  Lochia: light-moderate  Extremities: 1+ dependent edema, no calf pain or tenderness   A: PPD # 1              ABL anemia  Doing well - stable status  P:  Routine post partum orders             Start iron and colace  Anticipate DC in AM  Marlinda Mike CNM, MSN, FACNM 08/12/2012, 10:00 AM

## 2012-08-13 MED ORDER — DSS 100 MG PO CAPS: 100.0000 mg | ORAL_CAPSULE | Freq: Every day | ORAL | Status: DC

## 2012-08-13 MED ORDER — OXYCODONE-ACETAMINOPHEN 5-325 MG PO TABS: 1.0000 | ORAL_TABLET | ORAL | Status: DC | PRN

## 2012-08-13 MED ORDER — IBUPROFEN 600 MG PO TABS: 600.0000 mg | ORAL_TABLET | Freq: Four times a day (QID) | ORAL | Status: DC

## 2012-08-13 MED ORDER — POLYSACCHARIDE IRON COMPLEX 150 MG PO CAPS: 150.0000 mg | ORAL_CAPSULE | Freq: Every day | ORAL | Status: DC

## 2012-08-13 NOTE — Discharge Summary (Signed)
Obstetric Discharge Summary Reason for Admission: onset of labor Prenatal Procedures: none Intrapartum Procedures: spontaneous vaginal delivery and vacuum Postpartum Procedures: none Complications-Operative and Postpartum: 2nd degree perineal laceration Hemoglobin  Date Value Range Status  08/12/2012 9.4* 12.0 - 15.0 g/dL Final     DELTA CHECK NOTED     REPEATED TO VERIFY     HCT  Date Value Range Status  08/12/2012 28.9* 36.0 - 46.0 % Final    Physical Exam:  General: alert, cooperative and no distress Lochia: appropriate Uterine Fundus: firm Incision: healing well DVT Evaluation: No evidence of DVT seen on physical exam.  Discharge Diagnoses: Term Pregnancy-delivered and ABL anemia  Discharge Information: Date: 08/13/2012 Activity: pelvic rest Diet: routine Medications: PNV, Ibuprofen, Colace, Iron and Percocet Condition: stable Instructions: refer to practice specific booklet Discharge to: home Follow-up Information   Follow up with LAVOIE,MARIE-LYNE, MD. Schedule an appointment as soon as possible for a visit in 6 weeks.   Contact information:   [address] [place] [address] [phone]       Newborn Data: Live born female  Birth Weight: 7 lb 9.9 oz (3455 g) APGAR: 8, 9  Home with mother.  Marlinda Mike 08/13/2012, 9:33 AM

## 2012-08-13 NOTE — Progress Notes (Signed)
PPD 2 SVD  S:  Reports feeling better today - still lots of soreness but no pain             Tolerating po/ No nausea or vomiting             Bleeding is light             Pain controlled with motrin and percocet             Up ad lib / ambulatory / voiding QS  Newborn breast feeding  O:               VS: BP 119/74  Pulse 94  Temp(Src) 98.2 F (36.8 C) (Oral)  Resp 20  Ht 5\' 1"  (1.549 m)  Wt 77.747 kg (171 lb 6.4 oz)  BMI 32.4 kg/m2  SpO2 98%  LMP 11/18/2011   LABS:  Recent Labs  08/11/12 0310 08/12/12 0605  WBC 11.2* 17.6*  HGB 11.5* 9.4*  PLT 228 193                                             Physical Exam:             Alert and oriented X3  Abdomen: soft, non-tender, non-distended              Fundus: firm, non-tender, U-1  Perineum: no edema  Lochia: light  Extremities: 1+ pedal edema, no calf pain or tenderness    A: PPD # 2   Doing well - stable status  P:  Routine post partum orders  Dc home - WOB booklet / instructions reviewed  Marlinda Mike CNM, MSN, FACNM 08/13/2012, 9:30 AM

## 2013-01-21 ENCOUNTER — Ambulatory Visit (INDEPENDENT_AMBULATORY_CARE_PROVIDER_SITE_OTHER): Payer: BC Managed Care – PPO | Admitting: Family Medicine

## 2013-01-21 ENCOUNTER — Ambulatory Visit: Payer: BC Managed Care – PPO

## 2013-01-21 VITALS — BP 100/60 | HR 66 | Temp 97.9°F | Resp 16 | Ht 62.0 in | Wt 131.0 lb

## 2013-01-21 DIAGNOSIS — M545 Low back pain, unspecified: Secondary | ICD-10-CM

## 2013-01-21 DIAGNOSIS — S335XXA Sprain of ligaments of lumbar spine, initial encounter: Secondary | ICD-10-CM

## 2013-01-21 MED ORDER — IBUPROFEN 800 MG PO TABS: 800.0000 mg | ORAL_TABLET | Freq: Three times a day (TID) | ORAL | Status: DC | PRN

## 2013-01-21 MED ORDER — KETOROLAC TROMETHAMINE 30 MG/ML IJ SOLN
30.0000 mg | Freq: Once | INTRAMUSCULAR | Status: AC
  Administered 2013-01-21: 30 mg via INTRAMUSCULAR

## 2013-01-21 NOTE — Patient Instructions (Signed)
Esguince de la cintura  con rehabilitación  (Low Back Sprain with Rehab)  Un esguince es una lesión en la que el ligamento se desgarra. Los ligamentos de la cintura son susceptibles de sufrir esguinces. Sin embargo, estos ligamentos son muy fuertes y se requiere de una gran fuerza para lesionarlos. Son importantes para estabilizar la médula espinal. Los esguinces se clasifican en tres categorías. Los esguinces de grado 1 ocasionan dolor, pero el tendón no está alargado. En los esguinces de grado 2 hay un ligamento alargado, debido a un estiramiento o desgarro parcial. En el esguince de grado 2 aún se mantiene la función, aunque ésta puede estar alterada. Un esguince en grado 3 es la ruptura completa del músculo o el tendón, y suele quedar incapacitada la función.  SÍNTOMAS  · Dolor intenso en la cintura.  · Sensación de estallido o ruptura en el momento de la lesión.  · Sensibilidad y a veces hinchazón en la zona de la lesión.  · Algunas veces, hematoma (contusión) en el lugar de la lesión dentro de las 48 horas.  · Espasmos musculares en la espalda.  CAUSAS  El esguince se produce cuando se aplica una fuerza en el ligamento que es mayor de lo que puede soportar. Las causas más frecuentes de la lesión son:  · Realizar una actividad estresante en una posición incómoda.  · Actividades estresantes repetidas que implican movimiento de la cintura.  · Golpe directo en la cintura (traumatismo).  LOS RIESGOS AUMENTAN CON:  · Deportes de contacto (fútbol, lucha).  · Colisiones (principalmente accidentes de esquí).  · Deportes que requieren arrojar o levantar algún elemento (levantamiento de pesas, béisbol).  · Deportes que implican girar la columna (gimnasia, clavados, tenis, golf)  · Poca fuerza y flexibilidad.  · Protección inadecuada.  · Cirugías previas en la espalda (especialmente fusión).  PREVENCIÓN  · Use el equipo protector adecuado y que le ajuste bien.  · Precalentamiento adecuado y elongación antes de la  actividad.  · Descanso y recuperación entre actividades.  · Mantener la forma física:  · Fuerza, flexibilidad y resistencia muscular.  · Capacidad cardiovascular.  · Mantenga un peso corporal adecuado.  PRONÓSTICO  Si se trata adecuadamente, estos esguinces pueden curarse con tratamiento no quirúrgico. El tiempo de curación depende de la gravedad de la lesión.   posibles complicaciones:  · La recurrencia frecuente de los síntomas puede dar como resultado un problema crónico.  · Inflamación crónica y dolor en la cintura.  · Retraso en la curación o resolución de los síntomas, en particular si se retoma la actividad rápidamente.  · Discapacidad prolongada.  · Articulación inestable o artrítica en la cintura.  TRATAMIENTO  El tratamiento inicial incluye el uso de medicamentos y la aplicación de hielo para reducir el dolor y la inflamación. Los ejercicios de elongación y fortalecimiento pueden ayudar a reducir el dolor con la actividad. Los ejercicios pueden realizarse en el hogar o con un terapeuta. Los casos graves pueden requerir la derivación a un fisioterapeuta para realizar una evaluación y comenzar un tratamiento, como ultrasonido. El profesional podrá indicarle el uso de un dispositivo ortopédico para ayudar a reducir el dolor y la inflamación. A menudo, demasiado reposo en cama podrá resultar en más daños que beneficios. Podrán prescribirle inyecciones de corticoides. Sin embargo, esto deberá reservarse para los casos más graves. Es importante evitar el uso de la espalda cuando se levantan objetos. Por la noche, se aconseja que usted duerma boca arriba, sobre un colchón firme y   coloque una almohada debajo de las rodillas.  Si no se obtiene éxito con el tratamiento conservador, será necesario someterse a una cirugía.   MEDICAMENTOS   · Si es necesaria la administración de medicamentos para el dolor, se recomiendan los antiinflamatorios no esteroides, como aspirina e ibuprofeno y otros calmantes menores, como  acetaminofeno.  · No tome medicamentos para el dolor dentro de los 7 días previos a la cirugía.  · El profesional podrá prescribirle calmantes si lo considera necesario. Utilícelos como se le indique y sólo cuando lo necesite.  · Podrá beneficiarse con pomadas sobre la piel.  · En algunos casos se indica una inyección de corticosteroides. Estas inyecciones deben reservarse para los casos graves, porque sólo se pueden administrar una determinada cantidad de veces.  CALOR Y FRÍO   · El frío (con hielo) debe aplicarse durante 10 a 15 minutos cada 2 ó 3 horas para reducir la inflamación y el dolor e inmediatamente después de cualquier actividad que agrava los síntomas. Utilice bolsas o un masaje de hielo.  · El calor puede usarse antes de elongar y de las actividades de fortalecimiento indicadas por el profesional, le fisioterapeuta o el entrenador. Utilice una bolsa térmica o un paño húmedo.  SOLICITE ATENCIÓN MÉDICA SI:   · Los síntomas empeoran o no mejoran en 2 a 4 semanas, aún realizando un tratamiento.  · Presenta adormecimiento o debilidad en alguna de las piernas.  · Pérdida del control del intestino o de la vejiga.  · Luego de la cirugía observa lo siguiente: fiebre, dolor intenso, hinchazón, enrojecimiento, drena líquido o sangra en la región de la herida.  · Desarrolla nuevos e inexplicables síntomas. (Los medicamentos utilizados en el tratamiento le ocasionan efectos secundarios).  EJERCICIOS   EJERCICIOS DE AMPLITUD DE MOVIMIENTOS Y ELONGACIÓN - Esguince de cintura  La mayoría de las personas con dolor de espalda baja encuentran que sus síntomas empeoran al doblarse hacia adelante (flexión) o al arquear la región inferior de la espalda (extensión). Los ejercicios que le ayudarán a resolver sus síntomas se centrarán en el movimiento contrario.   El médico, fisioterapeuta o entrenador le ayudarán a determinar qué ejercicios serán de ayuda para resolver su dolor de espalda. No realice ningún ejercicio sin  consultarlo antes con el profesional. Discontinúe los ejercicios que empeoran sus síntomas, hasta que hable con el médico.  Si siente dolor, entumecimiento u hormigueo que irradia hacia los glúteos, piernas o pies, el objetivo de esta terapia es que estos síntomas se acerquen a la espalda y eventualmente desaparezcan. A veces, estos síntomas en las piernas mejoran, pero el dolor de espalda empeora.  Este suele ser un indicio de progreso en su rehabilitación. Asegúrese de que estar atento a cualquier cambio en sus síntomas y las actividades que ha hecho en las 24 horas antes del cambio. Compartir esta información con su médico le permitirá un mejor tratamiento para tratar su enfermedad.  Estos ejercicios le ayudarán en la recuperación de la lesión. Los síntomas podrán aliviarse con o sin asistencia adicional de su médico, fisioterapeuta o entrenador. Al completar estos ejercicios, recuerde:   · Restaurar la flexibilidad del tejido ayuda a que las articulaciones recuperen el movimiento normal. Esto permite que el movimiento y la actividad sea más saludables y menos dolorosos.  · Para que sea efectiva, cada elongación debe realizarse durante al menos 30 segundos.  · La elongación nunca debe ser dolorosa. Deberá sentir sólo un alargamiento o distensión suave del tejido que estira.  EJERCICIOS DE AMPLITUD DE   MOVIMIENTOS Y ELONGACIÓN:  ELONGACION Flexión - una rodilla al pecho  · Recuéstese en una cama dura o sobre el piso, con ambas piernas extendidas al frente.  · Manteniendo una pierna en contacto con el piso, lleve la rodilla opuesta al pecho. Mantenga la pierna en esa posición, sosteniéndola por la zona posterior del muslo o por la rodilla.  · Presione hasta sentir un suave estiramiento en la cintura. Mantenga esta posición durante __________ segundos.  · Libere la pierna lentamente y repita el ejercicio con el lado opuesto.  Repítalo __________ veces. Realice este ejercicio __________ veces por día.   ELONGACIÓN -  Flexión, dos rodillas al pecho   · Recuéstese en una cama dura o sobre el piso, con ambas piernas extendidas al frente.  · Manteniendo una pierna en contacto con el piso, lleve la rodilla opuesta al pecho.  · Tense los músculos del estómago para apoyar la espalda y levante la otra rodilla hacia el pecho. Mantenga las piernas en su lugar y tómese por detrás de las caderas o las rodillas.  · Con ambas rodillas en el pecho, tire hasta que sienta un estiramiento en la parte trasera de la espalda. Mantenga esta posición durante __________ segundos.  · Tense los músculos del estómago y baje las piernas de a una por vez.  Repítalo __________ veces. Realice este ejercicio __________ veces por día.   ELONGACIÓN - Rotación de la zona baja del tronco  · Recuéstese sobre una cama firme o sobre el suelo. Mantenga las piernas al frente, doble las rodillas de modo que ambas apunten hacia el techo y los pies queden bien apoyados en el piso.  · Extienda los brazos a cada lado. Esto estabilizará la zona superior del cuerpo, manteniendo los hombros en contacto con el piso.  · Con cuidado y lentamente deje caer ambas rodillas juntas hacia un lado, hasta que sienta un suave estiramiento en la espalda baja. Mantenga esta posición durante __________ segundos.  · Tensione los músculos del estómago para sostener la cintura mientras lleva las rodillas nuevamente a la posición inicial. Repita el ejercicio hacia el otro lado.  Repítalo __________ veces. Realice este ejercicio __________ veces por día.  EJERCICIOS DE AMPLITUD DE MOVIMIENTOS Y FLEXIBILIDAD:  ELONGACIÓN - Extensión posición prona sobre los codos  · Acuéstese sobre el estómago sobre el piso, una cama será muy blanda. Coloque las palmas a una distancia igual al ancho de los hombres y a la altura de la cabeza.  · Coloque los codos bajo los hombros. Si siente dolor, colóquese almohadas debajo del pecho.  · Deje que su cuerpo se relaje, de modo que las caderas queden más abajo y  tengan más contacto con el piso.  · Mantenga esta posición durante __________ segundos.  · Vuelva lentamente a la posición plana sobre el piso.  Repítalo __________ veces. Realice este ejercicio __________ veces por día.   AMPLITUD DE MOVIMIENTOS - Extensión - flexión de brazos en posición prona  · Acuéstese sobre el estómago sobre el piso, una cama será muy blanda. Coloque las palmas a una distancia igual al ancho de los hombres y a la altura de la cabeza.  · Mantenga la espalda tan relajada como pueda, enderece lentamente los codos mientras mantiene las caderas contra el suelo. Puede modificar la posición de las manos para estar más cómodo. A medida que gana movimiento, sus manos quedarán más por debajo de los hombros.  · Mantenga cada posición durante __________ segundos.  · Vuelva lentamente a la   posición plana sobre el piso.  Repítalo __________ veces. Realice este ejercicio __________ veces por día.   AMPLITUD DE MOVIMIENTOS - Cuadrúpedo [place] vertebral neutral  · Coloque las manos y las rodillas en una superficie firme. Las manos deben quedar a la altura de los hombros y las rodillas debajo de las caderas. Puede colocar algo debajo las rodillas para estar más cómodo.  · Haga caer la cabeza y apunte el cóccix hacia el suelo debajo de usted. De este modo se redondeará la cintura, en una postura similar a un gato enojado. Mantenga esta posición durante __________ segundos.  · Lentamente levante la cabeza y afloje el cóccix para que se hunda el cuerpo en un gran arco, como un caballo.  · Mantenga esta posición durante __________ segundos.  · Repítalo hasta sentir calor en la cintura.  · Ahora encuentre su "punto ideal". Será la posición más cómoda entre las dos posiciones anteriores. En esta posición es cuando su columna está neutral. Una vez que encuentre la posición, tensione los músculos del estómago para sostener la zona inferior de la espalda.  · Mantenga esta posición durante __________  segundos.  Repítalo __________ veces. Realice este ejercicio __________ veces por día.   EJERCICIOS DE FORTALECIMIENTO - Esguince de la cintura  Estos ejercicios le ayudarán en la recuperación de la lesión. Estos ejercicios deben hacerse cerca de su "punto dulce". Este es el arco neutro, de la parte baja de la espalda, en algún lugar entre la posición completamente redondeada y arqueada plenamente, que es la posición menos dolorosa. Cuando se realiza en este nivel de seguridad del movimiento, estos ejercicios se pueden utilizar para las personas que tienen una lesión basada en flexión o extensión. Con estos ejercicios, los síntomas podrán desaparecer con o sin mayor intervención del profesional, el fisioterapeuta o el entrenador. Al completar estos ejercicios, recuerde:   · Los músculos pueden ganar la resistencia y la fuerza necesarias para las actividades diarias a través de ejercicios controlados.  · Realice los ejercicios como se lo indicó el médico, el fisioterapeuta o el entrenador. Aumente la resistencia y las repeticiones según se le haya indicado.  · Podrá experimentar dolor o cansancio muscular, pero el dolor o molestia que trata de eliminar a través de los ejercicios nunca debe empeorar. Si el dolor empeora, deténgase y asegúrese de que está siguiendo las directivas correctamente. Si aún siente dolor luego de realizar lo ajustes necesarios, deberá discontinuar el ejercicio hasta que pueda conversar con el profesional sobre el problema.  FORTALECIMIENTO - Abdominales profundos - Inclinación pélvica  · Recuéstese sobre una cama firme o sobre el suelo. Mantenga las piernas al frente, doble las rodillas de modo que ambas apunten hacia el techo y los pies queden bien apoyados en el piso.  · Tensione la zona baja de los músculos abdominales para presionar la cintura contra el piso. Este movimiento hará rotar su pelvis de modo que el cóccix quede hacia arriba y no apuntando a los pies o hacia el piso.  Con una  tensión suave y respiración pareja, mantenga esta posición durante __________ segundos.  Repítalo __________ veces. Realice este ejercicio __________ veces por día.   FORTALECIMIENTO - Abdominales encogimiento abdominal.  · Recuéstese sobre una cama firme o sobre el suelo. Mantenga las piernas al frente, doble las rodillas de modo que ambas apunten hacia el techo y los pies queden bien apoyados en el piso. Cruce las manos sobre el pecho.  · Apunte suavemente con la barbilla hacia abajo, sin doblar   el cuello.  · Tensione los abdominales y eleve lentamente el tronco la altura suficiente para despegar los omóplatos. Si se eleva más, pondrá tensión excesiva en la cintura y esto no fortalecerá más los abdominales.  · Controle la vuelta a la posición inicial.  Repítalo __________ veces. Realice este ejercicio __________ veces por día.   EN CUATRO MIEMBROS - Cuadrúpedo, elevación de miembro superior e inferior opuestos   · Coloque las manos y las rodillas en una superficie firme. Las manos deben quedar a la altura de los hombros y las rodillas debajo de las caderas. Puede colocar algo debajo las rodillas para estar más cómodo.  · Encuentre la posición neutral de la columna vertebral y tensione ligeramente los músculos abdominales de modo que pueda mantener esta posición. Los hombros y las caderas deben formar un rectángulo paralelo con el suelo y recto.  · Manteniendo el tronco firme, eleve la mano derecha a la altura del hombro y luego eleve la pierna izquierda a la altura de la cadera. Asegúrese de no contener la respiración. Mantenga esta posición durante __________ segundos.  · Con los músculos abdominales en tensión y la espalda firme, vuelva lentamente a la posición inicial. Repita con el otro brazo y la otra pierna.  · Repítalo __________ veces. Realice este ejercicio __________ veces por día.  FUERZA - Abdominales y cuádriceps - Levantar las piernas rectas  · Recuéstese en una cama dura o sobre el piso, con ambas  piernas extendidas al frente.  · Deje una pierna en contacto con el suelo y doble la otra rodilla de manera que el pie quede contra el suelo.  · Encuentre la posición neutral de la columna vertebral y tensione ligeramente los músculos abdominales de modo que pueda mantener esta posición.  · Levante lentamente la pierna del suelo una 6 pulgadas y cuente hasta 15, asegúrese de no contener la respiración.  · Mantega la columna en posición neutral, y baje lentamente la pierna hasta el suelo.  Repita el ejercicio con cada pierna __________ veces. Realice este ejercicio __________ veces por día.  CONSIDERACIONES ACERCA DE LA POSTURA Y LA MECÁNICA DEL CUERPO   Esguince de la cintura  Si mantiene una postura correcta cuando se encuentre de pie, sentado o realizando sus actividades, reducirá el estrés en los diferentes tejidos del cuerpo, y permitirá a los tejidos lesionados la posibilidad de curarse y limitar las experiencias dolorosas. A continuación se indican pautas generales para mejorar la postura. Su médico o fisioterapeuta le dará instrucciones específicas según sus necesidades. Al leer estas pautas recuerde:  · Los ejercicios indicados por su médico lo ayudarán a lograr la flexibilidad y la fuerza para mantener las posturas correctas.  · La postura correcta proporciona el mejor entorno de trabajo para las articulaciones. Las articulaciones se desgastan menos cuando están sostenidas adecuadamente por una columna vertebral en buena postura. Esto significa que su cuerpo estará más sano y dolerá menos.  · La correcta postura debe practicarse en todas las actividades, especialmente al estar sentado o de pie durante mucho tiempo. También es importante al realizar actividades repetitivas de bajo estrés (tipeo) o una actividad única y pesada.  POSICIONES DE DESCANSO  Tenga en cuenta cuáles son las posturas que más dolor le causan al elegir una posición de descanso. Si siente dolor con las actividades en que deba realizar  una flexión (sentarse, inclinarse, detenerse, ponerse en cuclillas), elija una posición que le permita descansar en una postura menos flexionada. Evite curvarse en posición fetal cuando se   encuentre de lado. Si el dolor empeora con las actividades basadas en la extensión (estar de pie durante un tiempo prolongado, trabajar con las manos por arriba de la cabeza) evite descansar en una posición extendida durante mucho tiempo, como dormir sobre el estómago. La mayoría de las personas encontrará cómodo el descanso sobre la columna vertebral en una posición neutral, ni muy redondeada ni muy arqueada. Recuéstese sobre su lado en una cama que no esté hundida con una almohada entre las rodillas o sobre la espalda con una almohada bajo las rodillas, y sentirá alivio. Tenga en cuenta que cualquier posición en tiempo prolongado, no importa si es una postura correcta, puede provocarle rigidez.  POSTURAS CORRECTAS PARA SENTARSE  Con el fin de minimizar el estrés y el malestar en su columna, deberá sentarse con la postura correcta. Sentarse con una buena postura debe ser algo sin esfuerzo para un cuerpo sano.  Recuperar una buena postura es un proceso gradual. Muchas personas pueden trabajar más cómodas mediante el uso de diferentes soportes hasta que tengan la flexibilidad y la fuerza para mantener esta postura por su cuenta.  Al sentarse con la postura correcta, los oídos deben estar sobre los hombros y los hombros sobre las caderas.  Debe utilizar el respaldo de la silla para apoyar la espalda. La espalda estará en una posición neutral, ligeramente arqueada. Puede colocar una pequeña almohada o toalla doblada en la base de la espalda baja para  apoyo.   Si trabaja en un escritorio, cree un ambiente que le proporciones un buen soporte y una postura erguida. Sin apoyo adicional, músculos se cansan, lo que lleva a una tensión excesiva en las articulaciones y otros tejidos.  Tenga en cuenta estas recomendaciones:  SILLA:   · La  silla debe poder deslizarse por debajo del escritorio cuando su espalda tome contacto con el respaldo. Esto le permitirá trabajar más cerca.  · La altura de la silla debe permitirle que los ojos tengan el nivel de la parte superior del monitor y las manos estén más abajo que los codos.  POSICIÓN DEL CUERPO  · Los pies deben tener contacto con el piso. Si no es posible, use un posapies.  · Mantenga las orejas sobre los hombros. Esto reducirá el estrés en el cuello y en la cintura.  POSTURAS INCORRECTAS PARA SENTARSE  Si se siente cansado e incapaz de asumir una postura sentada sana, no se eche hacia atrás. Esto pone una tensión excesiva en los tejidos de su espalda, y causa más daño y dolor.  Entre las opciones más saludables se incluyen:  · El uso de más apoyo, como una almohada lumbar.  · Cambio de tareas, a algo que demande una posición vertical o caminar.  · Tomar una breve caminata.  · Recostarse y descansar en una posición neutral.  DE PIE DURANTE UN TIEMPO PROLONGADO E INCLINADO LIGERAMENTE HACIA ADELANTE  Cuando deba realizar una tarea que requiera inclinación hacia adelante estando de pie en el mismo sitio durante mucho tiempo, coloque un pie en un objeto de 2 a 4 pulgadas de alto, para mantener una mejor postura. Cuando ambos pies están en el piso, la zona inferior de la espalda tiene a perder su ligera curvatura hacia adentro. Si esta curva se aplana (o se pronuncia demasiado) la espalda y las articulaciones experimentarán demasiado estrés, se fatigarán más rápidamente y causarán dolor.   POSTURAS CORRECTAS PARA ESTAR DE PIE  Una postura adecuada de pie realizarse en todas las actividades diarias, incluso si sólo toman un momento,   como al cepillarse los dientes. Como en la postura de sentado, los oídos deben estar sobre los hombros y los hombros sobre las caderas.  Deberá mantener una ligera tensión en sus músculos abdominales para asegurar la columna vertebral. El cóccix debe apuntar hacia el suelo, no  detrás de su cuerpo, ya que resultaría en una curvatura de la espalda sobre-extendida.   POSTURAS INCORRECTAS PARA ESTAR DE PIE  Las posturas incorrectas para estar de pie incluyen tener la cabeza hacia delante, las rodillas bloqueadas o una excesiva curvatura de la espalda.  CAMINAR  Camine en una postura erguida. Las orejas, hombros y caderas deben estar alineados.  ACTIVIDAD PROLONGADA EN UNA POSICIÓN FLEXIONADA  Al completar una tarea que requiere que se doble la cintura hacia adelante o inclinarse sobre una superficie baja, trate de encontrar una manera de estabilizar 3 de cada 4 de sus miembros. Puede colocar una mano o el codo en el muslo, o descansar una rodilla en la superficie en la que está apoyado. Esto le proporcionará más estabilidad para que sus músculos no se cansen tan rápidamente.  El mantener las rodillas relajadas, o ligeramente dobladas, también reducirá el estrés en la espalda baja.  TÉCNICAS CORRECTAS PARA LEVANTAR OBJETOS  SI:   · Asumir una postura amplia. Esto le proporcionará más estabilidad y la oportunidad de acercarse lo más posible al objeto que se está levantando.  · Tense los abdominales para asegurar la columna vertebral. Doble las rodillas y las caderas. Manteniendo la espalda en una posición neutral, haga el esfuerzo con los músculos de la pierna.  Levántese con las piernas, manteniendo la espalda derecha.  · Pruebe el peso de los objetos desconocidos antes de tratar de levantarlos.  · Trate de mantener los codos hacia abajo y a los lados, con el fin de obtener la fuerza de los hombros al llevar un objeto.  · Siempre pida ayuda a otra persona cuando deba levantar objetos pesados o incómodos.  TÉCNICAS INCORRECTAS PARA LEVANTAR OBJETOS  NO:   · Bloquee rodillas al levantar, aunque sea un objeto pequeño.  · Se doble ni gire. Gire sobre los pies ni los mueva cuando necesite cambiar de dirección.  · Considere que no puede levantar incluso un clip de papel con seguridad, sin una  postura correcta.  Document Released: 12/21/2005 Document Revised: 05/29/2011  [employer]® Patient Information ©2014 [employer].
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
## 2013-01-24 NOTE — Progress Notes (Signed)
Appointment made for 12/15.

## 2013-03-03 ENCOUNTER — Ambulatory Visit: Payer: BC Managed Care – PPO | Admitting: Family Medicine

## 2013-03-25 ENCOUNTER — Telehealth: Payer: Self-pay

## 2015-01-20 ENCOUNTER — Ambulatory Visit (INDEPENDENT_AMBULATORY_CARE_PROVIDER_SITE_OTHER): Payer: BC Managed Care – PPO | Admitting: Physician Assistant

## 2015-01-20 ENCOUNTER — Ambulatory Visit (INDEPENDENT_AMBULATORY_CARE_PROVIDER_SITE_OTHER): Payer: BC Managed Care – PPO

## 2015-01-20 VITALS — BP 122/72 | HR 77 | Temp 98.3°F | Resp 17 | Ht 61.0 in | Wt 134.0 lb

## 2015-01-20 DIAGNOSIS — K5900 Constipation, unspecified: Secondary | ICD-10-CM | POA: Diagnosis not present

## 2015-01-20 DIAGNOSIS — S39012A Strain of muscle, fascia and tendon of lower back, initial encounter: Secondary | ICD-10-CM

## 2015-01-20 DIAGNOSIS — M545 Low back pain: Secondary | ICD-10-CM

## 2015-01-20 MED ORDER — KETOROLAC TROMETHAMINE 60 MG/2ML IM SOLN
60.0000 mg | Freq: Once | INTRAMUSCULAR | Status: AC
  Administered 2015-01-20: 60 mg via INTRAMUSCULAR

## 2015-01-20 MED ORDER — CYCLOBENZAPRINE HCL 10 MG PO TABS: 10.0000 mg | ORAL_TABLET | Freq: Three times a day (TID) | ORAL | Status: DC | PRN

## 2015-01-20 MED ORDER — MELOXICAM 15 MG PO TABS
15.0000 mg | ORAL_TABLET | Freq: Every day | ORAL | Status: DC
Start: 2015-01-20 — End: 2015-10-12

## 2015-01-20 MED ORDER — POLYETHYLENE GLYCOL 3350 17 GM/SCOOP PO POWD: 17.0000 g | Freq: Two times a day (BID) | ORAL | Status: AC | PRN

## 2015-01-20 NOTE — Patient Instructions (Signed)
Esguince de la cintura con rehabilitación  (Low Back Sprain With Rehab)  Un esguince es una lesión en la que el ligamento se desgarra. Los ligamentos de la cintura son susceptibles de sufrir esguinces. Sin embargo, estos ligamentos son muy fuertes y se requiere de una gran fuerza para lesionarlos. Son importantes para estabilizar la médula espinal. Los esguinces se clasifican en tres categorías. Los esguinces de grado 1 ocasionan dolor, pero el tendón no está alargado. En los esguinces de grado 2 hay un ligamento alargado, debido a un estiramiento o desgarro parcial. En el esguince de grado 2 aún se mantiene la función, aunque ésta puede estar alterada. Un esguince en grado 3 es la ruptura completa del músculo o el tendón, y suele quedar incapacitada la función.  SÍNTOMAS  · Dolor intenso en la cintura.  · Sensación de estallido o ruptura en el momento de la lesión.  · Sensibilidad y a veces hinchazón en la zona de la lesión.  · Algunas veces, hematoma (contusión) en el lugar de la lesión dentro de las 48 horas.  · Espasmos musculares en la espalda.  CAUSAS  El esguince se produce cuando se aplica una fuerza en el ligamento que es mayor de lo que puede soportar. Las causas más frecuentes de la lesión son:  · Realizar una actividad estresante en una posición incómoda.  · Actividades estresantes repetidas que implican movimiento de la cintura.  · Golpe directo en la cintura (traumatismo).  LOS RIESGOS AUMENTAN CON:  · Deportes de contacto (fútbol, lucha).  · Colisiones (principalmente accidentes de esquí).  · Deportes que requieren arrojar o levantar algún elemento (levantamiento de pesas, béisbol).  · Deportes que implican girar la columna (gimnasia, clavados, tenis, golf)  · Poca fuerza y flexibilidad.  · Protección inadecuada.  · Cirugías previas en la espalda (especialmente fusión).  PREVENCIÓN  · Use el equipo protector adecuado y que le ajuste bien.  · Precalentamiento adecuado y elongación antes de la  actividad.  · Descanso y recuperación entre actividades.  · Mantener la forma física:    Fuerza, flexibilidad y resistencia muscular.    Capacidad cardiovascular.  · Mantenga un peso corporal adecuado.  PRONÓSTICO  Si se trata adecuadamente, estos esguinces pueden curarse con tratamiento no quirúrgico. El tiempo de curación depende de la gravedad de la lesión.   posibles complicaciones:  · La recurrencia frecuente de los síntomas puede dar como resultado un problema crónico.  · Inflamación crónica y dolor en la cintura.  · Retraso en la curación o resolución de los síntomas, en particular si se retoma la actividad rápidamente.  · Discapacidad prolongada.  · Articulación inestable o artrítica en la cintura.  TRATAMIENTO  El tratamiento inicial incluye el uso de medicamentos y la aplicación de hielo para reducir el dolor y la inflamación. Los ejercicios de elongación y fortalecimiento pueden ayudar a reducir el dolor con la actividad. Los ejercicios pueden realizarse en el hogar o con un terapeuta. Los casos graves pueden requerir la derivación a un fisioterapeuta para realizar una evaluación y comenzar un tratamiento, como ultrasonido. El profesional podrá indicarle el uso de un dispositivo ortopédico para ayudar a reducir el dolor y la inflamación. A menudo, demasiado reposo en cama podrá resultar en más daños que beneficios. Podrán prescribirle inyecciones de corticoides. Sin embargo, esto deberá reservarse para los casos más graves. Es importante evitar el uso de la espalda cuando se levantan objetos. Por la noche, se aconseja que usted duerma boca arriba, sobre un colchón firme   y coloque una almohada debajo de las rodillas.  Si no se obtiene éxito con el tratamiento conservador, será necesario someterse a una cirugía.   MEDICAMENTOS   · Si es necesaria la administración de medicamentos para el dolor, se recomiendan los antiinflamatorios no esteroides, como aspirina e ibuprofeno y otros calmantes menores, como  acetaminofeno.  · No tome medicamentos para el dolor dentro de los 7 días previos a la cirugía.  · El profesional podrá prescribirle calmantes si lo considera necesario. Utilícelos como se le indique y sólo cuando lo necesite.  · Podrá beneficiarse con pomadas sobre la piel.  · En algunos casos se indica una inyección de corticosteroides. Estas inyecciones deben reservarse para los casos graves, porque sólo se pueden administrar una determinada cantidad de veces.  CALOR Y FRÍO   · El frío (con hielo) debe aplicarse durante 10 a 15 minutos cada 2 ó 3 horas para reducir la inflamación y el dolor e inmediatamente después de cualquier actividad que agrava los síntomas. Utilice bolsas o un masaje de hielo.  · El calor puede usarse antes de elongar y de las actividades de fortalecimiento indicadas por el profesional, le fisioterapeuta o el entrenador. Utilice una bolsa térmica o un paño húmedo.  SOLICITE ATENCIÓN MÉDICA SI:   · Los síntomas empeoran o no mejoran en 2 a 4 semanas, aún realizando un tratamiento.  · Presenta adormecimiento o debilidad en alguna de las piernas.  · Pérdida del control del intestino o de la vejiga.  · Luego de la cirugía observa lo siguiente: fiebre, dolor intenso, hinchazón, enrojecimiento, drena líquido o sangra en la región de la herida.  · Desarrolla nuevos e inexplicables síntomas. (Los medicamentos utilizados en el tratamiento le ocasionan efectos secundarios).  EJERCICIOS   EJERCICIOS DE AMPLITUD DE MOVIMIENTOS Y ELONGACIÓN - Esguince de cintura  La mayoría de las personas con dolor de espalda baja encuentran que sus síntomas empeoran al doblarse hacia adelante (flexión) o al arquear la región inferior de la espalda (extensión). Los ejercicios que le ayudarán a resolver sus síntomas se centrarán en el movimiento contrario.   El médico, fisioterapeuta o entrenador le ayudarán a determinar qué ejercicios serán de ayuda para resolver su dolor de espalda. No realice ningún ejercicio sin  consultarlo antes con el profesional. Discontinúe los ejercicios que empeoran sus síntomas, hasta que hable con el médico.  Si siente dolor, entumecimiento u hormigueo que irradia hacia los glúteos, piernas o pies, el objetivo de esta terapia es que estos síntomas se acerquen a la espalda y eventualmente desaparezcan. A veces, estos síntomas en las piernas mejoran, pero el dolor de espalda empeora.  Este suele ser un indicio de progreso en su rehabilitación. Asegúrese de que estar atento a cualquier cambio en sus síntomas y las actividades que ha hecho en las 24 horas antes del cambio. Compartir esta información con su médico le permitirá un mejor tratamiento para tratar su enfermedad.  Estos ejercicios le ayudarán en la recuperación de la lesión. Los síntomas podrán aliviarse con o sin asistencia adicional de su médico, fisioterapeuta o entrenador. Al completar estos ejercicios, recuerde:   · Restaurar la flexibilidad del tejido ayuda a que las articulaciones recuperen el movimiento normal. Esto permite que el movimiento y la actividad sea más saludables y menos dolorosos.  · Para que sea efectiva, cada elongación debe realizarse durante al menos 30 segundos.  · La elongación nunca debe ser dolorosa. Deberá sentir sólo un alargamiento o distensión suave del tejido que estira.  EJERCICIOS DE AMPLITUD   DE MOVIMIENTOS Y ELONGACIÓN:  ELONGACION Flexión - una rodilla al pecho  · Recuéstese en una cama dura o sobre el piso, con ambas piernas extendidas al frente.  · Manteniendo una pierna en contacto con el piso, lleve la rodilla opuesta al pecho. Mantenga la pierna en esa posición, sosteniéndola por la zona posterior del muslo o por la rodilla.  · Presione hasta sentir un suave estiramiento en la cintura. Mantenga esta posición durante __________ segundos.  · Libere la pierna lentamente y repita el ejercicio con el lado opuesto.  Repítalo __________ veces. Realice este ejercicio __________ veces por día.   ELONGACIÓN -  Flexión, dos rodillas al pecho   · Recuéstese en una cama dura o sobre el piso, con ambas piernas extendidas al frente.  · Manteniendo una pierna en contacto con el piso, lleve la rodilla opuesta al pecho.  · Tense los músculos del estómago para apoyar la espalda y levante la otra rodilla hacia el pecho. Mantenga las piernas en su lugar y tómese por detrás de las caderas o las rodillas.  · Con ambas rodillas en el pecho, tire hasta que sienta un estiramiento en la parte trasera de la espalda. Mantenga esta posición durante __________ segundos.  · Tense los músculos del estómago y baje las piernas de a una por vez.  Repítalo __________ veces. Realice este ejercicio __________ veces por día.   ELONGACIÓN - Rotación de la zona baja del tronco  · Recuéstese sobre una cama firme o sobre el suelo. Mantenga las piernas al frente, doble las rodillas de modo que ambas apunten hacia el techo y los pies queden bien apoyados en el piso.  · Extienda los brazos a cada lado. Esto estabilizará la zona superior del cuerpo, manteniendo los hombros en contacto con el piso.  · Con cuidado y lentamente deje caer ambas rodillas juntas hacia un lado, hasta que sienta un suave estiramiento en la espalda baja. Mantenga esta posición durante __________ segundos.  · Tensione los músculos del estómago para sostener la cintura mientras lleva las rodillas nuevamente a la posición inicial. Repita el ejercicio hacia el otro lado.  Repítalo __________ veces. Realice este ejercicio __________ veces por día.  EJERCICIOS DE AMPLITUD DE MOVIMIENTOS Y FLEXIBILIDAD:  ELONGACIÓN - Extensión posición prona sobre los codos  · Acuéstese sobre el estómago sobre el piso, una cama será muy blanda. Coloque las palmas a una distancia igual al ancho de los hombres y a la altura de la cabeza.  · Coloque los codos bajo los hombros. Si siente dolor, colóquese almohadas debajo del pecho.  · Deje que su cuerpo se relaje, de modo que las caderas queden más abajo y  tengan más contacto con el piso.  · Mantenga esta posición durante __________ segundos.  · Vuelva lentamente a la posición plana sobre el piso.  Repítalo __________ veces. Realice este ejercicio __________ veces por día.   AMPLITUD DE MOVIMIENTOS - Extensión - flexión de brazos en posición prona  · Acuéstese sobre el estómago sobre el piso, una cama será muy blanda. Coloque las palmas a una distancia igual al ancho de los hombres y a la altura de la cabeza.  · Mantenga la espalda tan relajada como pueda, enderece lentamente los codos mientras mantiene las caderas contra el suelo. Puede modificar la posición de las manos para estar más cómodo. A medida que gana movimiento, sus manos quedarán más por debajo de los hombros.  · Mantenga cada posición durante __________ segundos.  · Vuelva lentamente a   la posición plana sobre el piso.  Repítalo __________ veces. Realice este ejercicio __________ veces por día.   AMPLITUD DE MOVIMIENTOS - Cuadrúpedo [place] vertebral neutral  · Coloque las manos y las rodillas en una superficie firme. Las manos deben quedar a la altura de los hombros y las rodillas debajo de las caderas. Puede colocar algo debajo las rodillas para estar más cómodo.  · Haga caer la cabeza y apunte el cóccix hacia el suelo debajo de usted. De este modo se redondeará la cintura, en una postura similar a un gato enojado. Mantenga esta posición durante __________ segundos.  · Lentamente levante la cabeza y afloje el cóccix para que se hunda el cuerpo en un gran arco, como un caballo.  · Mantenga esta posición durante __________ segundos.  · Repítalo hasta sentir calor en la cintura.  · Ahora encuentre su "punto ideal". Será la posición más cómoda entre las dos posiciones anteriores. En esta posición es cuando su columna está neutral. Una vez que encuentre la posición, tensione los músculos del estómago para sostener la zona inferior de la espalda.  · Mantenga esta posición durante __________  segundos.  Repítalo __________ veces. Realice este ejercicio __________ veces por día.   EJERCICIOS DE FORTALECIMIENTO - Esguince de la cintura  Estos ejercicios le ayudarán en la recuperación de la lesión. Estos ejercicios deben hacerse cerca de su "punto dulce". Este es el arco neutro, de la parte baja de la espalda, en algún lugar entre la posición completamente redondeada y arqueada plenamente, que es la posición menos dolorosa. Cuando se realiza en este nivel de seguridad del movimiento, estos ejercicios se pueden utilizar para las personas que tienen una lesión basada en flexión o extensión. Con estos ejercicios, los síntomas podrán desaparecer con o sin mayor intervención del profesional, el fisioterapeuta o el entrenador. Al completar estos ejercicios, recuerde:   · Los músculos pueden ganar la resistencia y la fuerza necesarias para las actividades diarias a través de ejercicios controlados.  · Realice los ejercicios como se lo indicó el médico, el fisioterapeuta o el entrenador. Aumente la resistencia y las repeticiones según se le haya indicado.  · Podrá experimentar dolor o cansancio muscular, pero el dolor o molestia que trata de eliminar a través de los ejercicios nunca debe empeorar. Si el dolor empeora, deténgase y asegúrese de que está siguiendo las directivas correctamente. Si aún siente dolor luego de realizar lo ajustes necesarios, deberá discontinuar el ejercicio hasta que pueda conversar con el profesional sobre el problema.  FORTALECIMIENTO - Abdominales profundos - Inclinación pélvica  · Recuéstese sobre una cama firme o sobre el suelo. Mantenga las piernas al frente, doble las rodillas de modo que ambas apunten hacia el techo y los pies queden bien apoyados en el piso.  · Tensione la zona baja de los músculos abdominales para presionar la cintura contra el piso. Este movimiento hará rotar su pelvis de modo que el cóccix quede hacia arriba y no apuntando a los pies o hacia el piso.  Con una  tensión suave y respiración pareja, mantenga esta posición durante __________ segundos.  Repítalo __________ veces. Realice este ejercicio __________ veces por día.   FORTALECIMIENTO - Abdominales encogimiento abdominal.  · Recuéstese sobre una cama firme o sobre el suelo. Mantenga las piernas al frente, doble las rodillas de modo que ambas apunten hacia el techo y los pies queden bien apoyados en el piso. Cruce las manos sobre el pecho.  · Apunte suavemente con la barbilla hacia abajo, sin   doblar el cuello.  · Tensione los abdominales y eleve lentamente el tronco la altura suficiente para despegar los omóplatos. Si se eleva más, pondrá tensión excesiva en la cintura y esto no fortalecerá más los abdominales.  · Controle la vuelta a la posición inicial.  Repítalo __________ veces. Realice este ejercicio __________ veces por día.   EN CUATRO MIEMBROS - Cuadrúpedo, elevación de miembro superior e inferior opuestos   · Coloque las manos y las rodillas en una superficie firme. Las manos deben quedar a la altura de los hombros y las rodillas debajo de las caderas. Puede colocar algo debajo las rodillas para estar más cómodo.  · Encuentre la posición neutral de la columna vertebral y tensione ligeramente los músculos abdominales de modo que pueda mantener esta posición. Los hombros y las caderas deben formar un rectángulo paralelo con el suelo y recto.  · Manteniendo el tronco firme, eleve la mano derecha a la altura del hombro y luego eleve la pierna izquierda a la altura de la cadera. Asegúrese de no contener la respiración. Mantenga esta posición durante __________ segundos.  · Con los músculos abdominales en tensión y la espalda firme, vuelva lentamente a la posición inicial. Repita con el otro brazo y la otra pierna.  · Repítalo __________ veces. Realice este ejercicio __________ veces por día.  FUERZA - Abdominales y cuádriceps - Levantar las piernas rectas  · Recuéstese en una cama dura o sobre el piso, con ambas  piernas extendidas al frente.  · Deje una pierna en contacto con el suelo y doble la otra rodilla de manera que el pie quede contra el suelo.  · Encuentre la posición neutral de la columna vertebral y tensione ligeramente los músculos abdominales de modo que pueda mantener esta posición.  · Levante lentamente la pierna del suelo una 6 pulgadas y cuente hasta 15, asegúrese de no contener la respiración.  · Mantega la columna en posición neutral, y baje lentamente la pierna hasta el suelo.  Repita el ejercicio con cada pierna __________ veces. Realice este ejercicio __________ veces por día.  CONSIDERACIONES ACERCA DE LA POSTURA Y LA MECÁNICA DEL CUERPO   Esguince de la cintura  Si mantiene una postura correcta cuando se encuentre de pie, sentado o realizando sus actividades, reducirá el estrés en los diferentes tejidos del cuerpo, y permitirá a los tejidos lesionados la posibilidad de curarse y limitar las experiencias dolorosas. A continuación se indican pautas generales para mejorar la postura. Su médico o fisioterapeuta le dará instrucciones específicas según sus necesidades. Al leer estas pautas recuerde:  · Los ejercicios indicados por su médico lo ayudarán a lograr la flexibilidad y la fuerza para mantener las posturas correctas.  · La postura correcta proporciona el mejor entorno de trabajo para las articulaciones. Las articulaciones se desgastan menos cuando están sostenidas adecuadamente por una columna vertebral en buena postura. Esto significa que su cuerpo estará más sano y dolerá menos.  · La correcta postura debe practicarse en todas las actividades, especialmente al estar sentado o de pie durante mucho tiempo. También es importante al realizar actividades repetitivas de bajo estrés (tipeo) o una actividad única y pesada.  POSICIONES DE DESCANSO  Tenga en cuenta cuáles son las posturas que más dolor le causan al elegir una posición de descanso. Si siente dolor con las actividades en que deba realizar  una flexión (sentarse, inclinarse, detenerse, ponerse en cuclillas), elija una posición que le permita descansar en una postura menos flexionada. Evite curvarse en posición fetal cuando   se encuentre de lado. Si el dolor empeora con las actividades basadas en la extensión (estar de pie durante un tiempo prolongado, trabajar con las manos por arriba de la cabeza) evite descansar en una posición extendida durante mucho tiempo, como dormir sobre el estómago. La mayoría de las personas encontrará cómodo el descanso sobre la columna vertebral en una posición neutral, ni muy redondeada ni muy arqueada. Recuéstese sobre su lado en una cama que no esté hundida con una almohada entre las rodillas o sobre la espalda con una almohada bajo las rodillas, y sentirá alivio. Tenga en cuenta que cualquier posición en tiempo prolongado, no importa si es una postura correcta, puede provocarle rigidez.  POSTURAS CORRECTAS PARA SENTARSE  Con el fin de minimizar el estrés y el malestar en su columna, deberá sentarse con la postura correcta. Sentarse con una buena postura debe ser algo sin esfuerzo para un cuerpo sano.  Recuperar una buena postura es un proceso gradual. Muchas personas pueden trabajar más cómodas mediante el uso de diferentes soportes hasta que tengan la flexibilidad y la fuerza para mantener esta postura por su cuenta.  Al sentarse con la postura correcta, los oídos deben estar sobre los hombros y los hombros sobre las caderas.  Debe utilizar el respaldo de la silla para apoyar la espalda. La espalda estará en una posición neutral, ligeramente arqueada. Puede colocar una pequeña almohada o toalla doblada en la base de la espalda baja para  apoyo.   Si trabaja en un escritorio, cree un ambiente que le proporciones un buen soporte y una postura erguida. Sin apoyo adicional, músculos se cansan, lo que lleva a una tensión excesiva en las articulaciones y otros tejidos.  Tenga en cuenta estas recomendaciones:  SILLA:   · La  silla debe poder deslizarse por debajo del escritorio cuando su espalda tome contacto con el respaldo. Esto le permitirá trabajar más cerca.  · La altura de la silla debe permitirle que los ojos tengan el nivel de la parte superior del monitor y las manos estén más abajo que los codos.  POSICIÓN DEL CUERPO  · Los pies deben tener contacto con el piso. Si no es posible, use un posapies.  · Mantenga las orejas sobre los hombros. Esto reducirá el estrés en el cuello y en la cintura.  POSTURAS INCORRECTAS PARA SENTARSE  Si se siente cansado e incapaz de asumir una postura sentada sana, no se eche hacia atrás. Esto pone una tensión excesiva en los tejidos de su espalda, y causa más daño y dolor.  Entre las opciones más saludables se incluyen:  · El uso de más apoyo, como una almohada lumbar.  · Cambio de tareas, a algo que demande una posición vertical o caminar.  · Tomar una breve caminata.  · Recostarse y descansar en una posición neutral.  DE PIE DURANTE UN TIEMPO PROLONGADO E INCLINADO LIGERAMENTE HACIA ADELANTE  Cuando deba realizar una tarea que requiera inclinación hacia adelante estando de pie en el mismo sitio durante mucho tiempo, coloque un pie en un objeto de 2 a 4 pulgadas de alto, para mantener una mejor postura. Cuando ambos pies están en el piso, la zona inferior de la espalda tiene a perder su ligera curvatura hacia adentro. Si esta curva se aplana (o se pronuncia demasiado) la espalda y las articulaciones experimentarán demasiado estrés, se fatigarán más rápidamente y causarán dolor.   POSTURAS CORRECTAS PARA ESTAR DE PIE  Una postura adecuada de pie realizarse en todas las actividades diarias, incluso si sólo toman un   momento, como al cepillarse los dientes. Como en la postura de sentado, los oídos deben estar sobre los hombros y los hombros sobre las caderas.  Deberá mantener una ligera tensión en sus músculos abdominales para asegurar la columna vertebral. El cóccix debe apuntar hacia el suelo, no  detrás de su cuerpo, ya que resultaría en una curvatura de la espalda sobre-extendida.   POSTURAS INCORRECTAS PARA ESTAR DE PIE  Las posturas incorrectas para estar de pie incluyen tener la cabeza hacia delante, las rodillas bloqueadas o una excesiva curvatura de la espalda.  CAMINAR  Camine en una postura erguida. Las orejas, hombros y caderas deben estar alineados.  ACTIVIDAD PROLONGADA EN UNA POSICIÓN FLEXIONADA  Al completar una tarea que requiere que se doble la cintura hacia adelante o inclinarse sobre una superficie baja, trate de encontrar una manera de estabilizar 3 de cada 4 de sus miembros. Puede colocar una mano o el codo en el muslo, o descansar una rodilla en la superficie en la que está apoyado. Esto le proporcionará más estabilidad para que sus músculos no se cansen tan rápidamente.  El mantener las rodillas relajadas, o ligeramente dobladas, también reducirá el estrés en la espalda baja.  TÉCNICAS CORRECTAS PARA LEVANTAR OBJETOS  SI:   · Asumir una postura amplia. Esto le proporcionará más estabilidad y la oportunidad de acercarse lo más posible al objeto que se está levantando.  · Tense los abdominales para asegurar la columna vertebral. Doble las rodillas y las caderas. Manteniendo la espalda en una posición neutral, haga el esfuerzo con los músculos de la pierna.  Levántese con las piernas, manteniendo la espalda derecha.  · Pruebe el peso de los objetos desconocidos antes de tratar de levantarlos.  · Trate de mantener los codos hacia abajo y a los lados, con el fin de obtener la fuerza de los hombros al llevar un objeto.  · Siempre pida ayuda a otra persona cuando deba levantar objetos pesados o incómodos.  TÉCNICAS INCORRECTAS PARA LEVANTAR OBJETOS  NO:   · Bloquee rodillas al levantar, aunque sea un objeto pequeño.  · Se doble ni gire. Gire sobre los pies ni los mueva cuando necesite cambiar de dirección.  · Considere que no puede levantar incluso un clip de papel con seguridad, sin una  postura correcta.     Esta información no tiene como fin reemplazar el consejo del médico. Asegúrese de hacerle al médico cualquier pregunta que tenga.     Document Released: 12/21/2005 Document Revised: 07/21/2014  [employer] Interactive Patient Education ©2016 [employer]

## 2015-01-20 NOTE — Progress Notes (Addendum)
[facility] [address] [place] [address] [id number]  Date:  01/20/2015   Name:  Kara Roach   DOB:  January 12, 1986   MRN:  [id number]  PCP:  Kara Fries, MD    History of Present Illness:  Kara Roach is a 29 y.o. female patient who presents to [facility] for Roach back pain for the last 4 days.  Patient reports a localized low back sharp pain with tingling at the center of low back.  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  She has no incontinence, weakness or instability.  No numbness.  She has taken ibuprofen which has helped very little.  This back pain is similar to her past.  She was referred to PT for lumbar strain.  Patient reports that the pain improved but she will not be able to take go to PT due to cost.      Patient Active Problem List   Diagnosis Date Noted  . SVD (spontaneous vaginal delivery) 08/12/2012  . Postpartum care following vaginal delivery (5/25) 08/12/2012  . Acute blood loss anemia 08/12/2012  . ANXIETY, MILD 01/16/2008    Past Medical History  Diagnosis Date  . Medical history non-contributory     Past Surgical History  Procedure Laterality Date  . No past surgeries      Social History  Substance Use Topics  . Smoking status: Never Smoker   . Smokeless tobacco: None  . Alcohol Use: No    Family History  Problem Relation Age of Onset  . Diabetes Maternal Grandmother     Allergies  Allergen Reactions  . Penicillins     Abdominal pain     Medication list has been reviewed and updated.  Current Outpatient Prescriptions on File Prior to Visit  Medication Sig Dispense Refill  . ibuprofen (ADVIL,MOTRIN) 800 MG tablet Take 1 tablet (800 mg total) by mouth every 8 (eight) hours as needed. 45 tablet 0   No current facility-administered medications on file prior to visit.    ROS ROS otherwise unremarkable unless listed above.    Physical Examination: BP 122/72 mmHg  Pulse 77  Temp(Src) 98.3 F (36.8 C) (Oral)  Resp 17  Ht  (1.549 m)  Wt 134 lb (60.782 kg)  BMI 25.33 kg/m2  SpO2 94% Ideal Body Weight: Weight in (lb) to have BMI = 25: 132  Physical Exam  Constitutional: She is oriented to person, place, and time. She appears well-developed and well-nourished. No distress.  HENT:  Head: Normocephalic and atraumatic.  Right Ear: External ear normal.  Left Ear: External ear normal.  Eyes: Conjunctivae and EOM are normal. Pupils are equal, round, and reactive to light.  Cardiovascular: Normal rate and regular rhythm.  Exam reveals no gallop and no friction rub.   No murmur heard. Pulses:      Radial pulses are 2+ on the right side, and 2+ on the left side.       Dorsalis pedis pulses are 2+ on the right side, and 2+ on the left side.  Pulmonary/Chest: Effort normal. No respiratory distress.  Musculoskeletal:       Lumbar back: She exhibits decreased range of motion (45 degree forward flexion.  ), tenderness (pain incited at local spot with torso rotation, and very present with extension. and hip rotation at back.  ) and bony tenderness. She exhibits no swelling.  straight leg test  without pain radiating into the leg but pain incited   Neurological: She is alert and oriented to person, place, and time.  Skin: She is not diaphoretic.  Psychiatric: She has a normal mood and affect. Her behavior is normal.    [facility] reading (PRIMARY) by  Dr. Milus GlazierLauenstein: Stool burden, but no acute findings.  Assessment and Plan: [name] is a 29 y.o. female who is here today for chief complaint of low back pain localized to center of back.  This appears to be a low lumbar strain at this time.   -Given Toradol 60 mg  -mobic, and flexeril -advised ice, until prior to stretch where warmth should be done then ice directly after activity.   -Letter to work to rest today.  She will return if no improvement. If so,  I have advised that she start exercise and core strengthening with work duties.  Also advised to wear work belt which she states that she has. If no improvement after 7 days, possible referral needed for ortho involvement.  This appears to be reoccurring.   Miralax advised to rid stool burden.    Lumbar strain, initial encounter - Plan: ketorolac (TORADOL) injection 60 mg, meloxicam (MOBIC) 15 MG tablet, cyclobenzaprine (FLEXERIL) 10 MG tablet  Low back pain, unspecified back pain laterality, with sciatica presence unspecified - Plan: DG Sacrum/Coccyx, DG Lumbar Spine Complete, ketorolac (TORADOL) injection 60 mg  Constipation, unspecified constipation type - Plan: polyethylene glycol powder (GLYCOLAX/MIRALAX) powder    Trena PlattStephanie Kanoelani Dobies, PA-C [facility] 01/20/2015 8:52 AM

## 2015-01-24 ENCOUNTER — Encounter (HOSPITAL_COMMUNITY): Payer: Self-pay | Admitting: Emergency Medicine

## 2015-01-24 ENCOUNTER — Emergency Department (HOSPITAL_COMMUNITY)
Admission: EM | Admit: 2015-01-24 | Discharge: 2015-01-24 | Disposition: A | Discharge status: Home / Self Care | Payer: BC Managed Care – PPO | Source: Home / Self Care

## 2015-01-24 DIAGNOSIS — M545 Low back pain: Secondary | ICD-10-CM

## 2015-01-24 MED ORDER — KETOROLAC TROMETHAMINE 30 MG/ML IJ SOLN
INTRAMUSCULAR | Status: AC
  Filled 2015-01-24: qty 1

## 2015-01-24 MED ORDER — CYCLOBENZAPRINE HCL 10 MG PO TABS: 10.0000 mg | ORAL_TABLET | Freq: Three times a day (TID) | ORAL | Status: DC

## 2015-01-24 MED ORDER — KETOROLAC TROMETHAMINE 30 MG/ML IJ SOLN
30.0000 mg | Freq: Once | INTRAMUSCULAR | Status: AC
  Administered 2015-01-24: 30 mg via INTRAMUSCULAR

## 2015-01-24 NOTE — Discharge Instructions (Signed)
Back Pain, Adult °Back pain is very common in adults. The cause of back pain is rarely dangerous and the pain often gets better over time. The cause of your back pain may not be known. Some common causes of back pain include: °· Strain of the muscles or ligaments supporting the spine. °· Wear and tear (degeneration) of the spinal disks. °· Arthritis. °· Direct injury to the back. °For many people, back pain may return. Since back pain is rarely dangerous, most people can learn to manage this condition on their own. °HOME CARE INSTRUCTIONS °Watch your back pain for any changes. The following actions may help to lessen any discomfort you are feeling: °· Remain active. It is stressful on your back to sit or stand in one place for long periods of time. Do not sit, drive, or stand in one place for more than 30 minutes at a time. Take short walks on even surfaces as soon as you are able. Try to increase the length of time you walk each day. °· Exercise regularly as directed by your health care provider. Exercise helps your back heal faster. It also helps avoid future injury by keeping your muscles strong and flexible. °· Do not stay in bed. Resting more than 1-2 days can delay your recovery. °· Pay attention to your body when you bend and lift. The most comfortable positions are those that put less stress on your recovering back. Always use proper lifting techniques, including: °¨ Bending your knees. °¨ Keeping the load close to your body. °¨ Avoiding twisting. °· Find a comfortable position to sleep. Use a firm mattress and lie on your side with your knees slightly bent. If you lie on your back, put a pillow under your knees. °· Avoid feeling anxious or stressed. Stress increases muscle tension and can worsen back pain. It is important to recognize when you are anxious or stressed and learn ways to manage it, such as with exercise. °· Take medicines only as directed by your health care provider. Over-the-counter  medicines to reduce pain and inflammation are often the most helpful. Your health care provider may prescribe muscle relaxant drugs. These medicines help dull your pain so you can more quickly return to your normal activities and healthy exercise. °· Apply ice to the injured area: °¨ Put ice in a plastic bag. °¨ Place a towel between your skin and the bag. °¨ Leave the ice on for 20 minutes, 2-3 times a day for the first 2-3 days. After that, ice and heat may be alternated to reduce pain and spasms. °· Maintain a healthy weight. Excess weight puts extra stress on your back and makes it difficult to maintain good posture. °SEEK MEDICAL CARE IF: °· You have pain that is not relieved with rest or medicine. °· You have increasing pain going down into the legs or buttocks. °· You have pain that does not improve in one week. °· You have night pain. °· You lose weight. °· You have a fever or chills. °SEEK IMMEDIATE MEDICAL CARE IF:  °· You develop new bowel or bladder control problems. °· You have unusual weakness or numbness in your arms or legs. °· You develop nausea or vomiting. °· You develop abdominal pain. °· You feel faint. °  °This information is not intended to replace advice given to you by your health care provider. Make sure you discuss any questions you have with your health care provider. °  °Document Released: 03/06/2005 Document Revised: 03/27/2014 Document Reviewed: 07/08/2013 °Elsevier Interactive Patient Education ©2016 Elsevier   Inc. ° °Back Injury Prevention °Back injuries can be very painful. They can also be difficult to heal. After having one back injury, you are more likely to injure your back again. It is important to learn how to avoid injuring or re-injuring your back. The following tips can help you to prevent a back injury. °WHAT SHOULD I KNOW ABOUT PHYSICAL FITNESS? °· Exercise for 30 minutes per day on most days of the week or as told by your doctor. Make sure to: °¨ Do aerobic exercises,  such as walking, jogging, biking, or swimming. °¨ Do exercises that increase balance and strength, such as tai chi and yoga. °¨ Do stretching exercises. This helps with flexibility. °¨ Try to develop strong belly (abdominal) muscles. Your belly muscles help to support your back. °· Stay at a healthy weight. This helps to decrease your risk of a back injury. °WHAT SHOULD I KNOW ABOUT MY DIET? °· Talk with your doctor about your overall diet. Take supplements and vitamins only as told by your doctor. °· Talk with your doctor about how much calcium and vitamin D you need each day. These nutrients help to prevent weakening of the bones (osteoporosis). °· Include good sources of calcium in your diet, such as: °¨ Dairy products. °¨ Green leafy vegetables. °¨ Products that have had calcium added to them (fortified). °· Include good sources of vitamin D in your diet, such as: °¨ Milk. °¨ Foods that have had vitamin D added to them. °WHAT SHOULD I KNOW ABOUT MY POSTURE? °· Sit up straight and stand up straight. Avoid leaning forward when you sit or hunching over when you stand. °· Choose chairs that have good low-back (lumbar) support. °· If you work at a desk, sit close to it so you do not need to lean over. Keep your chin tucked in. Keep your neck drawn back. Keep your elbows bent so your arms look like the letter "L" (right angle). °· Sit high and close to the steering wheel when you drive. Add a low-back support to your car seat, if needed. °· Avoid sitting or standing in one position for very long. Take breaks to get up, stretch, and walk around at least one time every hour. Take breaks every hour if you are driving for long periods of time. °· Sleep on your side with your knees slightly bent, or sleep on your back with a pillow under your knees. Do not lie on the front of your body to sleep. °WHAT SHOULD I KNOW ABOUT LIFTING, TWISTING, AND REACHING °Lifting and Heavy Lifting  °· Avoid heavy lifting, especially lifting  over and over again. If you must do heavy lifting: °¨ Stretch before lifting. °¨ Work slowly. °¨ Rest between lifts. °¨ Use a tool such as a cart or a dolly to move objects if one is available. °¨ Make several small trips instead of carrying one heavy load. °¨ Ask for help when you need it, especially when moving big objects. °· Follow these steps when lifting: °¨ Stand with your feet shoulder-width apart. °¨ Get as close to the object as you can. Do not pick up a heavy object that is far from your body. °¨ Use handles or lifting straps if they are available. °¨ Bend at your knees. Squat down, but keep your heels off the floor. °¨ Keep your shoulders back. Keep your chin tucked in. Keep your back straight. °¨ Lift the object slowly while you tighten the muscles in your legs, belly, and butt.   Keep the object as close to the center of your body as possible.  Follow these steps when putting down a heavy load:  Stand with your feet shoulder-width apart.  Lower the object slowly while you tighten the muscles in your legs, belly, and butt. Keep the object as close to the center of your body as possible.  Keep your shoulders back. Keep your chin tucked in. Keep your back straight.  Bend at your knees. Squat down, but keep your heels off the floor.  Use handles or lifting straps if they are available. Twisting and Reaching  Avoid lifting heavy objects above your waist.  Do not twist at your waist while you are lifting or carrying a load. If you need to turn, move your feet.  Do not bend over without bending at your knees.  Avoid reaching over your head, across a table, or for an object on a high surface.  WHAT ARE SOME OTHER TIPS?  Avoid wet floors and icy ground. Keep sidewalks clear of ice to prevent falls.   Do not sleep on a mattress that is too soft or too hard.   Keep items that you use often within easy reach.   Put heavier objects on shelves at waist level, and put lighter objects  on lower or higher shelves.  Find ways to lower your stress, such as:  Exercise.  Massage.  Relaxation techniques.  Talk with your doctor if you feel anxious or depressed. These conditions can make back pain worse.  Wear flat heel shoes with cushioned soles.  Avoid making quick (sudden) movements.  Use both shoulder straps when carrying a backpack.  Do not use any tobacco products, including cigarettes, chewing tobacco, or electronic cigarettes. If you need help quitting, ask your doctor.   This information is not intended to replace advice given to you by your health care provider. Make sure you discuss any questions you have with your health care provider.   Document Released: 08/23/2007 Document Revised: 07/21/2014 Document Reviewed: 03/10/2014 Elsevier Interactive Patient Education [employer].

## 2015-01-24 NOTE — ED Provider Notes (Signed)
CSN: [id number]     Arrival date & time 01/24/15  1407 History   None    Chief Complaint  Patient presents with  . Back Pain   (Consider location/radiation/quality/duration/timing/severity/associated sxs/prior Treatment) HPI History obtained from patient:   LOCATION:low back SEVERITY:6 DURATION:several days CONTEXT:lifting trash bags at work QUALITY:similar to previous pains in back MODIFYING FACTORS:OTC meds, heat, ice, icy hot without relief ASSOCIATED SYMPTOMS:worse with movement TIMING:constant  Past Medical History  Diagnosis Date  . Medical history non-contributory    Past Surgical History  Procedure Laterality Date  . No past surgeries     Family History  Problem Relation Age of Onset  . Diabetes Maternal Grandmother    Social History  Substance Use Topics  . Smoking status: Never Smoker   . Smokeless tobacco: None  . Alcohol Use: No   OB History    Gravida Para Term Preterm AB TAB SAB Ectopic Multiple Living   Review of Systems ROS +'veback pain  Denies: HEADACHE, NAUSEA, ABDOMINAL PAIN, CHEST PAIN, CONGESTION, DYSURIA, SHORTNESS OF BREATH  Allergies  Penicillins  Home Medications   Prior to Admission medications   Medication Sig Start Date End Date Taking? Authorizing Provider  cyclobenzaprine (FLEXERIL) 10 MG tablet Take 1 tablet (10 mg total) by mouth 3 (three) times daily. 01/24/15   Tharon Aquas, PA  ibuprofen (ADVIL,MOTRIN) 800 MG tablet Take 1 tablet (800 mg total) by mouth every 8 (eight) hours as needed. 01/21/13   Ethelda Chick, MD  meloxicam (MOBIC) 15 MG tablet Take 1 tablet (15 mg total) by mouth daily. 01/20/15   Collie Siad English, PA  polyethylene glycol powder (GLYCOLAX/MIRALAX) powder Take 17 g by mouth 2 (two) times daily as needed. 01/20/15 01/27/15  Garnetta Buddy, PA   Meds Ordered and Administered this Visit   Medications  ketorolac (TORADOL) 30 MG/ML injection 30 mg (not administered)    BP  107/73 mmHg  Pulse 93  Temp(Src) 98.4 F (36.9 C) (Oral)  Resp 16  SpO2 100% No data found.   Physical Exam  Constitutional: She is oriented to person, place, and time. She appears well-developed and well-nourished. No distress.  HENT:  Head: Normocephalic and atraumatic.  Pulmonary/Chest: Effort normal and breath sounds normal.  Abdominal: Soft. Bowel sounds are normal.  Musculoskeletal: Normal range of motion. She exhibits no tenderness.       Lumbar back: She exhibits spasm. She exhibits normal range of motion, no tenderness and no swelling.       Back:  Neurological: She is alert and oriented to person, place, and time. She has normal reflexes.  Skin: Skin is warm and dry.  Psychiatric: She has a normal mood and affect. Her behavior is normal. Judgment and thought content normal.    ED Course  Procedures (including critical care time)  Labs Review Labs Reviewed - No data to display  Imaging Review No results found.   Visual Acuity Review  Right Eye Distance:   Left Eye Distance:   Bilateral Distance:    Right Eye Near:   Left Eye Near:    Bilateral Near:         MDM   1. Low back pain without sciatica, unspecified back pain laterality    Pt states that she has a bad disk. I have advised her that she has no signs or symptoms of disc disease at this time. She would also  need to have an MRI performed, which I have suggested she speak to her PCP about.   Pt request toradol, which has been administered.  Rx: Flexeril tid for 5 day Work note provided.  D/c home in stable condition    Tharon AquasFrank C Adeliz Tonkinson, [place] 01/24/15 1605

## 2015-01-24 NOTE — ED Notes (Signed)
[facility] a [facility] mid to lower back pain that started 1 week prior. The patient denied any injury.

## 2015-10-12 ENCOUNTER — Ambulatory Visit (INDEPENDENT_AMBULATORY_CARE_PROVIDER_SITE_OTHER): Payer: BC Managed Care – PPO | Admitting: Physician Assistant

## 2015-10-12 VITALS — BP 122/72 | HR 79 | Temp 98.7°F | Resp 17 | Ht 61.5 in | Wt 129.0 lb

## 2015-10-12 DIAGNOSIS — H8113 Benign paroxysmal vertigo, bilateral: Secondary | ICD-10-CM | POA: Diagnosis not present

## 2015-10-12 DIAGNOSIS — H8110 Benign paroxysmal vertigo, unspecified ear: Secondary | ICD-10-CM

## 2015-10-12 MED ORDER — MECLIZINE HCL 25 MG PO TABS: 25.0000 mg | ORAL_TABLET | Freq: Three times a day (TID) | ORAL | 0 refills | Status: DC | PRN

## 2015-10-12 MED ORDER — ONDANSETRON HCL 4 MG PO TABS: 4.0000 mg | ORAL_TABLET | Freq: Three times a day (TID) | ORAL | 0 refills | Status: DC | PRN

## 2015-10-12 NOTE — Patient Instructions (Addendum)
  Benign paroxysmal positional vertigo (BPPV) is a disorder arising from a problem in the inner ear.[3] Symptoms are repeated, brief periods of vertigo with movement, that is, of a spinning sensation upon changes in the position of the head.[1] This can occur with turning in bed or changing position. Each episode of vertigo typically lasts less than one minute.[3] Nausea is commonly associated.[6] BPPV is one of the most common causes of vertigo.[1][2] BPPV can result from a head injury or simply occur among those who are older. A specific cause is often not found. The underlying mechanism involves a small calcified otolith moving around loose in the inner ear. It is a type of balance disorder along with labyrinthitis and Mnire's disease.[3] Diagnosis is typically made when the Dix-Hallpike maneuver results in nystagmus (a specific movement pattern of the eyes) and other possible causes have been ruled out. In typical cases medical imaging is not needed.[1] BPPV is often treated with a number of simple movements such as the Epley maneuver or Brandt-Daroff exercises.[3][5] Medications may be used to help with nausea.[6] There is tentative evidence that betahistine may help with the vertigo but its use is not generally needed.[1][7] BPPV is not a serious condition. Typically it resolves in one to two weeks. It however may recur in some people.[6] The first medical description of the condition occurred in 1921 by Glenda Chroman.[8] About 2.4% of people are affected at some point in time.[1] Among those who live until their 80s, 10% have been affected.[2] BPPV affects females twice as often as males.[6] Onset is typically in the person's 50s to 70s.[2]    IF you received an x-ray today, you will receive an invoice from [facility]. Please contact [facility] at [phone] with questions or concerns regarding your invoice.   IF you received labwork today, you will receive an invoice from  [employer]. Please contact [facility] at [phone] with questions or concerns regarding your invoice.   Our billing staff will not be able to assist you with questions regarding bills from these companies.  You will be contacted with the lab results as soon as they are available. The fastest way to get your results is to activate your My Chart account. Instructions are located on the last page of this paperwork. If you have not heard from [place] regarding the results in 2 weeks, please contact this office.

## 2015-10-12 NOTE — Progress Notes (Signed)
   10/12/2015 5:12 PM   DOB: Mar 13, 1986 / MRN: [id number]  SUBJECTIVE:  Kara Roach is a 30 y.o. female presenting for dizziness.  Reports the dizziness started today and she reports the dizziness feels like spinning. She associates nausea.  This worse with head movement. She denies tinnitus.    She is allergic to penicillins.   She  has a past medical history of Medical history non-contributory.    She  reports that she has never smoked. She does not have any smokeless tobacco history on file. She reports that she does not drink alcohol or use drugs. She  reports that she currently engages in sexual activity. She reports using the following method of birth control/protection: None. The patient  has a past surgical history that includes No past surgeries.  Her family history includes Diabetes in her maternal grandmother.  Review of Systems  Constitutional: Negative for chills and fever.  Eyes: Negative for blurred vision.  Respiratory: Negative for cough.   Gastrointestinal: Positive for nausea.  Neurological: Positive for dizziness. Negative for headaches.  Psychiatric/Behavioral: Negative for depression.    Problem list and medications reviewed and updated by myself where necessary, and exist elsewhere in the encounter.   OBJECTIVE:  BP 122/72 (BP Location: Right Arm, Patient Position: Sitting, Cuff Size: Normal)   Pulse 79   Temp 98.7 F (37.1 C) (Oral)   Resp 17   Ht 5' 1.5" (1.562 m)   Wt 129 lb (58.5 kg)   SpO2 100%   BMI 23.98 kg/m   Physical Exam  Constitutional: She is oriented to person, place, and time. Vital signs are normal.  HENT:  Right Ear: Tympanic membrane normal.  Left Ear: Tympanic membrane normal.  Nose: Nose normal.  Mouth/Throat: Uvula is midline, oropharynx is clear and moist and mucous membranes are normal.  Cardiovascular: Normal rate and regular rhythm.   Pulmonary/Chest: Effort normal and breath sounds normal.  Neurological: She is  alert and oriented to person, place, and time. She has normal strength. She is not disoriented. She displays no tremor. No cranial nerve deficit or sensory deficit. Coordination and gait normal. GCS eye subscore is 4. GCS verbal subscore is 5. GCS motor subscore is 6.    No results found for this or any previous visit (from the past 72 hour(s)).  No results found.  ASSESSMENT AND PLAN  Kara Roach was seen today for dizziness and nausea.  Diagnoses and all orders for this visit:  BPPV (benign paroxysmal positional vertigo), unspecified laterality -     meclizine (ANTIVERT) 25 MG tablet; Take 1 tablet (25 mg total) by mouth 3 (three) times daily as needed for dizziness. -     ondansetron (ZOFRAN) 4 MG tablet; Take 1 tablet (4 mg total) by mouth every 8 (eight) hours as needed for nausea or vomiting.    The patient was advised to call or return to clinic if she does not see an improvement in symptoms, or to seek the care of the closest emergency department if she worsens with the above plan.   Deliah Boston, MHS, PA-C [facility] 10/12/2015 5:12 PM

## 2017-11-15 ENCOUNTER — Inpatient Hospital Stay (HOSPITAL_COMMUNITY): Payer: BC Managed Care – PPO

## 2017-11-15 ENCOUNTER — Encounter (HOSPITAL_COMMUNITY): Payer: Self-pay | Admitting: *Deleted

## 2017-11-15 ENCOUNTER — Other Ambulatory Visit: Payer: Self-pay

## 2017-11-15 ENCOUNTER — Encounter (HOSPITAL_COMMUNITY)
Admission: AD | Disposition: A | Discharge status: Home / Self Care | Payer: Self-pay | Source: Ambulatory Visit | Attending: Obstetrics & Gynecology

## 2017-11-15 ENCOUNTER — Ambulatory Visit (HOSPITAL_COMMUNITY)
Admission: AD | Admit: 2017-11-15 | Discharge: 2017-11-16 | Disposition: A | Discharge status: Home / Self Care | Payer: BC Managed Care – PPO | Source: Ambulatory Visit | Attending: Obstetrics & Gynecology | Admitting: Obstetrics & Gynecology

## 2017-11-15 ENCOUNTER — Inpatient Hospital Stay (HOSPITAL_COMMUNITY): Payer: BC Managed Care – PPO | Admitting: Anesthesiology

## 2017-11-15 DIAGNOSIS — O00101 Right tubal pregnancy without intrauterine pregnancy: Secondary | ICD-10-CM | POA: Diagnosis not present

## 2017-11-15 DIAGNOSIS — Z3A09 9 weeks gestation of pregnancy: Secondary | ICD-10-CM | POA: Diagnosis not present

## 2017-11-15 DIAGNOSIS — O209 Hemorrhage in early pregnancy, unspecified: Secondary | ICD-10-CM

## 2017-11-15 DIAGNOSIS — O4691 Antepartum hemorrhage, unspecified, first trimester: Secondary | ICD-10-CM | POA: Diagnosis present

## 2017-11-15 DIAGNOSIS — O009 Unspecified ectopic pregnancy without intrauterine pregnancy: Secondary | ICD-10-CM | POA: Diagnosis present

## 2017-11-15 HISTORY — DX: Unspecified ectopic pregnancy without intrauterine pregnancy: O00.90

## 2017-11-15 HISTORY — PX: LAPAROSCOPIC UNILATERAL SALPINGECTOMY: SHX5934

## 2017-11-15 HISTORY — PX: LAPAROSCOPY: SHX197

## 2017-11-15 LAB — TYPE AND SCREEN
ABO/RH(D): O POS
Antibody Screen: NEGATIVE

## 2017-11-15 LAB — CBC
HCT: 39.3 % (ref 36.0–46.0)
HEMOGLOBIN: 13.2 g/dL (ref 12.0–15.0)
MCH: 27 pg (ref 26.0–34.0)
MCHC: 33.6 g/dL (ref 30.0–36.0)
MCV: 80.5 fL (ref 78.0–100.0)
Platelets: 296 10*3/uL (ref 150–400)
RBC: 4.88 MIL/uL (ref 3.87–5.11)
RDW: 13.6 % (ref 11.5–15.5)
WBC: 7.8 10*3/uL (ref 4.0–10.5)

## 2017-11-15 LAB — ABO/RH: ABO/RH(D): O POS

## 2017-11-15 SURGERY — LAPAROSCOPY OPERATIVE
Anesthesia: General | Site: Abdomen | Laterality: Right

## 2017-11-15 MED ORDER — METOCLOPRAMIDE HCL 5 MG/ML IJ SOLN: 10.0000 mg | Freq: Once | INTRAMUSCULAR | Status: DC | PRN

## 2017-11-15 MED ORDER — ONDANSETRON HCL 4 MG/2ML IJ SOLN
INTRAMUSCULAR | Status: DC | PRN
  Administered 2017-11-15: 4 mg via INTRAVENOUS

## 2017-11-15 MED ORDER — DEXAMETHASONE SODIUM PHOSPHATE 4 MG/ML IJ SOLN
INTRAMUSCULAR | Status: AC
  Filled 2017-11-15: qty 1

## 2017-11-15 MED ORDER — GENTAMICIN SULFATE 40 MG/ML IJ SOLN
INTRAVENOUS | Status: AC
  Administered 2017-11-15: 310 mg via INTRAVENOUS
  Filled 2017-11-15: qty 7.75

## 2017-11-15 MED ORDER — SUGAMMADEX SODIUM 200 MG/2ML IV SOLN
INTRAVENOUS | Status: AC
  Filled 2017-11-15: qty 2

## 2017-11-15 MED ORDER — ONDANSETRON HCL 4 MG/2ML IJ SOLN
INTRAMUSCULAR | Status: AC
  Filled 2017-11-15: qty 2

## 2017-11-15 MED ORDER — PROPOFOL 10 MG/ML IV BOLUS
INTRAVENOUS | Status: DC | PRN
  Administered 2017-11-15: 180 mg via INTRAVENOUS

## 2017-11-15 MED ORDER — KETOROLAC TROMETHAMINE 30 MG/ML IJ SOLN
INTRAMUSCULAR | Status: AC
  Filled 2017-11-15: qty 1

## 2017-11-15 MED ORDER — BUPIVACAINE HCL (PF) 0.25 % IJ SOLN
INTRAMUSCULAR | Status: AC
  Filled 2017-11-15: qty 30

## 2017-11-15 MED ORDER — LACTATED RINGERS IV SOLN
INTRAVENOUS | Status: DC
  Administered 2017-11-15: 23:00:00 via INTRAVENOUS

## 2017-11-15 MED ORDER — SOD CITRATE-CITRIC ACID 500-334 MG/5ML PO SOLN
30.0000 mL | Freq: Once | ORAL | Status: AC
  Administered 2017-11-15: 30 mL via ORAL
  Filled 2017-11-15: qty 15

## 2017-11-15 MED ORDER — LACTATED RINGERS IV BOLUS: 1000.0000 mL | Freq: Once | INTRAVENOUS | Status: DC

## 2017-11-15 MED ORDER — ROCURONIUM BROMIDE 100 MG/10ML IV SOLN
INTRAVENOUS | Status: DC | PRN
  Administered 2017-11-15: 50 mg via INTRAVENOUS

## 2017-11-15 MED ORDER — SCOPOLAMINE 1 MG/3DAYS TD PT72
MEDICATED_PATCH | TRANSDERMAL | Status: DC | PRN
  Administered 2017-11-15: 1 via TRANSDERMAL

## 2017-11-15 MED ORDER — MIDAZOLAM HCL 2 MG/2ML IJ SOLN
INTRAMUSCULAR | Status: AC
  Filled 2017-11-15: qty 2

## 2017-11-15 MED ORDER — FAMOTIDINE IN NACL 20-0.9 MG/50ML-% IV SOLN
20.0000 mg | Freq: Once | INTRAVENOUS | Status: AC
  Administered 2017-11-15: 20 mg via INTRAVENOUS
  Filled 2017-11-15: qty 50

## 2017-11-15 MED ORDER — DEXAMETHASONE SODIUM PHOSPHATE 4 MG/ML IJ SOLN
INTRAMUSCULAR | Status: DC | PRN
  Administered 2017-11-15: 4 mg via INTRAVENOUS

## 2017-11-15 MED ORDER — LACTATED RINGERS IV SOLN: INTRAVENOUS | Status: DC

## 2017-11-15 MED ORDER — MEPERIDINE HCL 25 MG/ML IJ SOLN
6.2500 mg | INTRAMUSCULAR | Status: DC | PRN
  Administered 2017-11-16 (×2): 12.5 mg via INTRAVENOUS

## 2017-11-15 MED ORDER — FENTANYL CITRATE (PF) 100 MCG/2ML IJ SOLN
INTRAMUSCULAR | Status: DC | PRN
  Administered 2017-11-15: 100 ug via INTRAVENOUS
  Administered 2017-11-15 – 2017-11-16 (×3): 50 ug via INTRAVENOUS

## 2017-11-15 MED ORDER — SCOPOLAMINE 1 MG/3DAYS TD PT72
MEDICATED_PATCH | TRANSDERMAL | Status: AC
  Filled 2017-11-15: qty 1

## 2017-11-15 MED ORDER — FENTANYL CITRATE (PF) 250 MCG/5ML IJ SOLN
INTRAMUSCULAR | Status: AC
  Filled 2017-11-15: qty 5

## 2017-11-15 MED ORDER — FENTANYL CITRATE (PF) 100 MCG/2ML IJ SOLN: 25.0000 ug | INTRAMUSCULAR | Status: DC | PRN

## 2017-11-15 MED ORDER — ROCURONIUM BROMIDE 100 MG/10ML IV SOLN
INTRAVENOUS | Status: AC
  Filled 2017-11-15: qty 1

## 2017-11-15 MED ORDER — MIDAZOLAM HCL 2 MG/2ML IJ SOLN
INTRAMUSCULAR | Status: DC | PRN
  Administered 2017-11-15: 2 mg via INTRAVENOUS

## 2017-11-15 MED ORDER — LIDOCAINE HCL (CARDIAC) PF 100 MG/5ML IV SOSY
PREFILLED_SYRINGE | INTRAVENOUS | Status: AC
  Filled 2017-11-15: qty 5

## 2017-11-15 MED ORDER — BUPIVACAINE HCL (PF) 0.25 % IJ SOLN
INTRAMUSCULAR | Status: DC | PRN
  Administered 2017-11-15: 8 mL

## 2017-11-15 MED ORDER — PROPOFOL 10 MG/ML IV BOLUS
INTRAVENOUS | Status: AC
  Filled 2017-11-15: qty 20

## 2017-11-15 MED ORDER — KETOROLAC TROMETHAMINE 30 MG/ML IJ SOLN
INTRAMUSCULAR | Status: DC | PRN
  Administered 2017-11-15: 30 mg via INTRAVENOUS

## 2017-11-15 MED ORDER — LIDOCAINE HCL (CARDIAC) PF 100 MG/5ML IV SOSY
PREFILLED_SYRINGE | INTRAVENOUS | Status: DC | PRN
  Administered 2017-11-15: 80 mg via INTRAVENOUS

## 2017-11-15 MED ORDER — SUGAMMADEX SODIUM 500 MG/5ML IV SOLN
INTRAVENOUS | Status: AC
  Filled 2017-11-15: qty 10

## 2017-11-15 MED ORDER — SUGAMMADEX SODIUM 500 MG/5ML IV SOLN
INTRAVENOUS | Status: DC | PRN
  Administered 2017-11-15: 250 mg via INTRAVENOUS

## 2017-11-15 MED ORDER — SODIUM CHLORIDE 0.9 % IR SOLN
Status: DC | PRN
  Administered 2017-11-15: 3000 mL

## 2017-11-15 SURGICAL SUPPLY — 30 items
CABLE HIGH FREQUENCY MONO STRZ (ELECTRODE) IMPLANT
CATH ROBINSON RED A/P 16FR (CATHETERS) IMPLANT
DERMABOND ADVANCED (GAUZE/BANDAGES/DRESSINGS) ×1
DERMABOND ADVANCED .7 DNX12 (GAUZE/BANDAGES/DRESSINGS) ×2 IMPLANT
DRSG OPSITE POSTOP 3X4 (GAUZE/BANDAGES/DRESSINGS) ×3 IMPLANT
DURAPREP 26ML APPLICATOR (WOUND CARE) ×3 IMPLANT
FILTER SMOKE EVAC LAPAROSHD (FILTER) IMPLANT
GLOVE BIO SURGEON STRL SZ7 (GLOVE) ×3 IMPLANT
GLOVE BIOGEL PI IND STRL 7.0 (GLOVE) ×4 IMPLANT
GLOVE BIOGEL PI INDICATOR 7.0 (GLOVE) ×2
GOWN STRL REUS W/TWL LRG LVL3 (GOWN DISPOSABLE) ×9 IMPLANT
LIGASURE VESSEL 5MM BLUNT TIP (ELECTROSURGICAL) ×3 IMPLANT
MANIPULATOR UTERINE 4.5 ZUMI (MISCELLANEOUS) IMPLANT
NS IRRIG 1000ML POUR BTL (IV SOLUTION) ×3 IMPLANT
PACK LAPAROSCOPY BASIN (CUSTOM PROCEDURE TRAY) ×3 IMPLANT
PACK TRENDGUARD 450 HYBRID PRO (MISCELLANEOUS) IMPLANT
POUCH SPECIMEN RETRIEVAL 10MM (ENDOMECHANICALS) ×3 IMPLANT
PROTECTOR NERVE ULNAR (MISCELLANEOUS) ×6 IMPLANT
SCISSORS LAP 5X35 DISP (ENDOMECHANICALS) IMPLANT
SET IRRIG TUBING LAPAROSCOPIC (IRRIGATION / IRRIGATOR) ×3 IMPLANT
SLEEVE XCEL OPT CAN 5 100 (ENDOMECHANICALS) ×3 IMPLANT
SOLUTION ELECTROLUBE (MISCELLANEOUS) IMPLANT
SUT VICRYL 0 UR6 27IN ABS (SUTURE) ×3 IMPLANT
SUT VICRYL 4-0 PS2 18IN ABS (SUTURE) ×3 IMPLANT
TOWEL OR 17X24 6PK STRL BLUE (TOWEL DISPOSABLE) ×6 IMPLANT
TRENDGUARD 450 HYBRID PRO PACK (MISCELLANEOUS)
TROCAR BALLN 12MMX100 BLUNT (TROCAR) ×3 IMPLANT
TROCAR XCEL NON-BLD 5MMX100MML (ENDOMECHANICALS) ×3 IMPLANT
TUBING INSUF HEATED (TUBING) ×3 IMPLANT
WARMER LAPAROSCOPE (MISCELLANEOUS) ×3 IMPLANT

## 2017-11-15 NOTE — Anesthesia Procedure Notes (Signed)
Procedure Name: Intubation Date/Time: 11/15/2017 11:11 PM Performed by: Elenore Paddy, CRNA Pre-anesthesia Checklist: Patient identified, Emergency Drugs available, Suction available, Patient being monitored and Timeout performed Patient Re-evaluated:Patient Re-evaluated prior to induction Oxygen Delivery Method: Circle system utilized Preoxygenation: Pre-oxygenation with 100% oxygen Induction Type: IV induction Ventilation: Mask ventilation without difficulty Laryngoscope Size: Mac and 3 Grade View: Grade III Tube type: Oral Tube size: 7.0 mm Number of attempts: 2 Airway Equipment and Method: Stylet Placement Confirmation: ETT inserted through vocal cords under direct vision,  positive ETCO2 and breath sounds checked- equal and bilateral Secured at: 21 cm Tube secured with: Tape Dental Injury: Teeth and Oropharynx as per pre-operative assessment  Difficulty Due To: Difficult Airway- due to anterior larynx Future Recommendations: Recommend- induction with short-acting agent, and alternative techniques readily available

## 2017-11-15 NOTE — Anesthesia Preprocedure Evaluation (Signed)
Anesthesia Evaluation  Patient identified by MRN, date of birth, ID band Patient awake    Reviewed: Allergy & Precautions, NPO status , Patient's Chart, lab work & pertinent test results  Airway Mallampati: II  TM Distance: >3 FB Neck ROM: Full    Dental   Braces:   Pulmonary neg pulmonary ROS,    Pulmonary exam normal breath sounds clear to auscultation       Cardiovascular negative cardio ROS Normal cardiovascular exam Rhythm:Regular Rate:Normal     Neuro/Psych negative neurological ROS  negative psych ROS   GI/Hepatic negative GI ROS, Neg liver ROS,   Endo/Other  negative endocrine ROS  Renal/GU negative Renal ROS  negative genitourinary   Musculoskeletal negative musculoskeletal ROS (+)   Abdominal   Peds negative pediatric ROS (+)  Hematology negative hematology ROS (+)   Anesthesia Other Findings   Reproductive/Obstetrics (+) Pregnancy                             Anesthesia Physical Anesthesia Plan  ASA: II and emergent  Anesthesia Plan: General   Post-op Pain Management:    Induction: Intravenous  PONV Risk Score and Plan: 4 or greater and Ondansetron, Dexamethasone, Midazolam, Scopolamine patch - Pre-op and Treatment may vary due to age or medical condition  Airway Management Planned: Oral ETT  Additional Equipment:   Intra-op Plan:   Post-operative Plan: Extubation in OR  Informed Consent: I have reviewed the patients History and Physical, chart, labs and discussed the procedure including the risks, benefits and alternatives for the proposed anesthesia with the patient or authorized representative who has indicated his/her understanding and acceptance.   Dental advisory given  Plan Discussed with: CRNA  Anesthesia Plan Comments:         Anesthesia Quick Evaluation

## 2017-11-15 NOTE — MAU Note (Signed)
Pt sent from office due to abnormally rising quants. Denies pain. Reports vaginal bleeding.

## 2017-11-15 NOTE — MAU Provider Note (Addendum)
History     CSN: [id number]  Arrival date and time: 11/15/17 [id number]   First Provider Initiated Contact with Patient 11/15/17 1921      Chief Complaint  Patient presents with  . Vaginal Bleeding   HPI    Kara Roach is a 32 y.o. female G3P1011 @ 3186w5d here in MAU with complaints of vaginal bleeding. She has been seen in the office this week for Quants. Says her Ross LudwigQuants are not rising appropriately. She started bleeding 3 days ago. The bleeding is mucus like and brown with some red stripes. The bleeding has become heavier today. She denies pain associated with her bleeding. Highly desired pregnancy. She was sent her for an US.   OB History    Gravida  3   Para  1   Term  1   Preterm      AB  1   Living  1     SAB  1   TAB      Ectopic      Multiple      Live Births  1           Past Medical History:  Diagnosis Date  . Medical history non-contributory     Past Surgical History:  Procedure Laterality Date  . NO PAST SURGERIES      Family History  Problem Relation Age of Onset  . Diabetes Maternal Grandmother     Social History   Tobacco Use  . Smoking status: Never Smoker  . Smokeless tobacco: Never Used  Substance Use Topics  . Alcohol use: No  . Drug use: No    Allergies:  Allergies  Allergen Reactions  . Penicillins     Abdominal pain     Medications Prior to Admission  Medication Sig Dispense Refill Last Dose  . ibuprofen (ADVIL,MOTRIN) 800 MG tablet Take 1 tablet (800 mg total) by mouth every 8 (eight) hours as needed. 45 tablet 0 Taking  . meclizine (ANTIVERT) 25 MG tablet Take 1 tablet (25 mg total) by mouth 3 (three) times daily as needed for dizziness. 30 tablet 0   . ondansetron (ZOFRAN) 4 MG tablet Take 1 tablet (4 mg total) by mouth every 8 (eight) hours as needed for nausea or vomiting. 20 tablet 0    No results found for this or any previous visit (from the past 48 hour(s)).  Review of Systems  Gastrointestinal:  Negative for abdominal pain.  Genitourinary: Positive for vaginal bleeding.   Physical Exam   Blood pressure (!) 116/57, pulse 81, temperature 98.6 F (37 C), temperature source Oral, resp. rate 16, height 5\' 1"  (1.549 m), weight 62.6 kg, last menstrual period 09/08/2017, SpO2 99 %.  Physical Exam  Constitutional: She is oriented to person, place, and time. She appears well-developed and well-nourished. No distress.  HENT:  Head: Normocephalic.  Eyes: Pupils are equal, round, and reactive to light.  Respiratory: Effort normal.  GI: Soft. She exhibits no distension. There is no tenderness. There is no rebound and no guarding.  Genitourinary:  Genitourinary Comments:  Bimanual exam: Cervix FT, anterior. Small amount of thick, dark blood noted on exam glove.  Uterus non tender, enlarged  Adnexa non tender, no masses bilaterally Chaperone present for exam.   Musculoskeletal: Normal range of motion.  Neurological: She is alert and oriented to person, place, and time.  Skin: Skin is warm. She is not diaphoretic.  Psychiatric: Her behavior is normal.   MAU Course  Procedures  None  MDM  Quant 8/27: 3994 Quant 1/61:0960  ABO: O positive blood type  [place] ordered. Patient awaiting. Report given to Wynelle Bourgeois CNM who resumes care of the patient Report given to Wynelle Bourgeois CNM who resumes care of the patient.   Assessment and Plan  US Ob Transvaginal  Result Date: 11/15/2017 CLINICAL DATA:  Vaginal bleeding EXAM: TRANSVAGINAL OB ULTRASOUND TECHNIQUE: Transvaginal ultrasound was performed for complete evaluation of the gestation as well as the maternal uterus, adnexal regions, and pelvic cul-de-sac. COMPARISON:  None. FINDINGS: Intrauterine gestational sac: None Yolk sac:  Not visualized Embryo:  Not visualized Cardiac Activity: Not visualized Heart Rate:  bpm MSD:   mm    w     d CRL:     mm    w  d                  [place] EDC: Subchorionic hemorrhage:  None visualized. Maternal  uterus/adnexae: Ovaries are unremarkable. No free fluid. There is a soft tissue mass in the right adnexa separate from the right ovary measuring 3.1 x 1.8 x 1.8 cm medial to the right ovary. This is concerning for ectopic pregnancy. IMPRESSION: No intrauterine gestation visualized. Soft tissue mass in the right adnexa medial to the right ovary measures 3.1 x 1.8 x 1.8 cm concerning for right ectopic pregnancy. These results were called by telephone at the time of interpretation on 11/15/2017 at 9:04 pm to [facility], who verbally acknowledged these results. Electronically Signed   By: Charlett Nose M.D.   On: 11/15/2017 21:04   Dr Juliene Pina updated with results of Ultrasound Received call from Dr Kearney Hard who notified me of findings Dr Juliene Pina will come and talk to patient NPO for now  Aviva Signs, CNM

## 2017-11-15 NOTE — H&P (Signed)
Kara Roach is an 32 y.o. female G3P1011 here for abnormal quant HCG.  Patient was seen in the office on 4/27 for 9.3 weeks by LMP (but irreg menses), c/o spotting/ bleeding, cramping. Quant was 3994 and repeat quat at 48 hrs today was back at 3937.  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 Bleeding is mainly spotting. She denies intercourse since July when she conceived.  Prior 1 TAB and then term SVD, 49 yo daughter. No STIs/ G/C and no  PID or pelvic surgery hx.   Patient's last menstrual period was 09/08/2017.    Past Medical History:  Diagnosis Date  . Ectopic pregnancy without intrauterine pregnancy 11/15/2017  . Medical history non-contributory     Past Surgical History:  Procedure Laterality Date  . NO PAST SURGERIES      Family History  Problem Relation Age of Onset  . Diabetes Maternal Grandmother     Social History:  reports that she has never smoked. She has never used smokeless tobacco. She reports that she does not drink alcohol or use drugs.  Allergies:  Allergies  Allergen Reactions  . Penicillins     Abdominal pain     Medications Prior to Admission  Medication Sig Dispense Refill Last Dose  . ibuprofen (ADVIL,MOTRIN) 800 MG tablet Take 1 tablet (800 mg total) by mouth every 8 (eight) hours as needed. 45 tablet 0 Taking  . meclizine (ANTIVERT) 25 MG tablet Take 1 tablet (25 mg total) by mouth 3 (three) times daily as needed for dizziness. 30 tablet 0   . ondansetron (ZOFRAN) 4 MG tablet Take 1 tablet (4 mg total) by mouth every 8 (eight) hours as needed for nausea or vomiting. 20 tablet 0     ROS  Blood pressure (!) 116/57, pulse 81, temperature 98.6 F (37 C), temperature source Oral, resp. rate 16, height 5\' 1"  (1.549 m), weight 62.6 kg, last menstrual period 09/08/2017, SpO2 99 %. Physical Exam BP (!) 116/57 (BP Location: Right Arm)   Pulse 81   Temp 98.6 F (37 C) (Oral)   Resp 16    Ht 5\' 1"  (1.549 m)   Wt 62.6 kg   LMP 09/08/2017   SpO2 99%   BMI 26.07 kg/m   Physical exam:  A&O x 3, no acute distress. Pleasant HEENT neg Lungs CTA bilat CV RRR, S1S2 normal Abdo soft, non tender, non acute Extr no edema/ tenderness Pelvic Uterus and adnexa and cx wnl, non tender, no CMP   Results for orders placed or performed during the hospital encounter of 11/15/17 (from the past 24 hour(s))  ABO/Rh     Status: None   Collection Time: 11/15/17  7:30 PM  Result Value Ref Range   ABO/RH(D)      O POS Performed at [facility], [address] [place], [place] [address]     US Ob Transvaginal  Result Date: 11/15/2017 CLINICAL DATA:  Vaginal bleeding EXAM: TRANSVAGINAL OB ULTRASOUND TECHNIQUE: Transvaginal ultrasound was performed for complete evaluation of the gestation as well as the maternal uterus, adnexal regions, and pelvic cul-de-sac. COMPARISON:  None. FINDINGS: Intrauterine gestational sac: None Yolk sac:  Not visualized Embryo:  Not visualized Cardiac Activity: Not visualized Heart Rate:  bpm MSD:   mm    w     d CRL:     mm    w  d  US EDC: Subchorionic hemorrhage:  None visualized. Maternal uterus/adnexae: Ovaries are unremarkable. No free fluid. There is a soft tissue mass in the right adnexa separate from the right ovary measuring 3.1 x 1.8 x 1.8 cm medial to the right ovary. This is concerning for ectopic pregnancy. IMPRESSION: No intrauterine gestation visualized. Soft tissue mass in the right adnexa medial to the right ovary measures 3.1 x 1.8 x 1.8 cm concerning for right ectopic pregnancy. These results were called by telephone at the time of interpretation on 11/15/2017 at 9:04 pm to [facility], who verbally acknowledged these results. Electronically Signed   By: Charlett NoseKevin  Dover M.D.   On: 11/15/2017 21:04    Assessment/Plan: 32 yo G3P1011, pregnant with no evidence intrauterine pregnancy at quant HCG of 3900, with right adnexal mass 3.1x1.8 cm  separate from right ovary, very likely right adnexal/ tubal ectopic pregnancy without any pelvic free or loculated fluid c/w likely unruptured ectopic pregnancy. Stable patient.   Reviewed options with her and her husband Thayer OhmChris.  Laparoscopy with possible right salpingectomy if we see tubal ectopic V/s Methotrexate injection.  She meets almost all criteria for methotrexate except mass size being 3.1 cm. Her quant and stable status are in her favor for methotrexate but size is just at inclusion criteria. Also need to very close monitoring of quant HCG/ physical exams/ f/up sono reviewed Reviewed surgery with risks including infection/ bleeding/ damage to internal organs and loss of one fallopian tube limiting possibility of pregnancy in future. Reviewed risks of anesthesia and post-op complications.  After long discussion patient chose to proceed with Laparoscopy.  NPO since 12.30 pm Anesthesia and GynOR informed,OR  team en route.   Robley FriesVaishali R Joshuah Minella 11/15/2017, 10:16 PM

## 2017-11-15 NOTE — MAU Note (Signed)
Urine in lab 

## 2017-11-16 LAB — RPR: RPR: NONREACTIVE

## 2017-11-16 MED ORDER — ACETAMINOPHEN 500 MG PO TABS: 500.0000 mg | ORAL_TABLET | Freq: Four times a day (QID) | ORAL | 0 refills | Status: DC | PRN

## 2017-11-16 MED ORDER — OXYCODONE-ACETAMINOPHEN 5-325 MG PO TABS
ORAL_TABLET | ORAL | Status: AC
  Filled 2017-11-16: qty 1

## 2017-11-16 MED ORDER — MEPERIDINE HCL 25 MG/ML IJ SOLN
INTRAMUSCULAR | Status: AC
  Filled 2017-11-16: qty 1

## 2017-11-16 MED ORDER — OXYCODONE-ACETAMINOPHEN 5-325 MG PO TABS: 1.0000 | ORAL_TABLET | Freq: Four times a day (QID) | ORAL | 0 refills | Status: DC | PRN

## 2017-11-16 MED ORDER — OXYCODONE-ACETAMINOPHEN 5-325 MG PO TABS
1.0000 | ORAL_TABLET | ORAL | Status: DC | PRN
  Administered 2017-11-16: 1 via ORAL

## 2017-11-16 MED ORDER — HYDROMORPHONE HCL 1 MG/ML IJ SOLN
INTRAMUSCULAR | Status: DC | PRN
  Administered 2017-11-16: 1 mg via INTRAVENOUS

## 2017-11-16 MED ORDER — HYDROMORPHONE HCL 1 MG/ML IJ SOLN
INTRAMUSCULAR | Status: AC
  Filled 2017-11-16: qty 1

## 2017-11-16 NOTE — Transfer of Care (Signed)
Immediate Anesthesia Transfer of Care Note  Patient: Kara Roach  Procedure(s) Performed: LAPAROSCOPY OPERATIVE (N/A Abdomen) LAPAROSCOPIC UNILATERAL SALPINGECTOMY (Right )  Patient Location: PACU  Anesthesia Type:General  Level of Consciousness: awake, alert  and oriented  Airway & Oxygen Therapy: Patient Spontanous Breathing and Patient connected to nasal cannula oxygen  Post-op Assessment: Report given to RN and Post -op Vital signs reviewed and stable  Post vital signs: Reviewed and stable HR 85, RR 16, SaO2 100%, BP 108/48  Last Vitals:  Vitals Value Taken Time  BP 108/48 11/16/2017 12:15 AM  Temp    Pulse 80 11/16/2017 12:16 AM  Resp 15 11/16/2017 12:16 AM  SpO2 100 % 11/16/2017 12:16 AM  Vitals shown include unvalidated device data.  Last Pain:  Vitals:   11/15/17 1834  TempSrc: Oral         Complications: No apparent anesthesia complications

## 2017-11-16 NOTE — Op Note (Signed)
Preoperative diagnosis: Suspected right adnexal ectopic pregnancy Postoperative diagnosis: Right tubal ectopic pregnancy Procedure: Laparoscopic right salpingectomy  Surgeon: Dr Shea EvansVaishali Zayveon Raschke, MD Assistants: Arlan Organaniela Paul, CNM  Anesthesia Gen. Endotracheal IV fluids LR  1400 cc LR EBL minimal  5 cc Urine clear  100 cc clear in foley Complications none Disposition PACU and home Specimens : Right fallopian tube with ectopic  Procedure  Patient is 32 yo female with no intrauterine pregnancy, quant HCG 3900 and right adnexal 3.1 x 2 cm mass, suspected ectopic pregnancy.  Options were reviewed, we chose to proceed with diagnostic laparoscopy with intent to perform salpingectomy if it were a tubal ectopic pregnancy. Risk and complications of surgery including infection, bleeding, damage to internal organs, other complications including pneumonia, VTE were reviewed. Informed written consent was obtained and patient was brought to the operating room with IV running. Timeout was carried out. Gentamicin/ Clindamycin given for surgical prophylaxis.  She underwent general anesthesia without difficulty and was given dorsal lithotomy position. She was prepped and draped in standard fashion and foley was placed. Hulka manipulator inserted and secured to cervix. 10 mm vertical incision made at the umbilicus lower edge after injecting marcaine. Fascia grasped and incised. Peritoneum entered after visualization. Hassan canula with balloon placed, balloon inflated. Pneumoperitoneum created.  Right tubal ectopic noted. There was no evidence of bleeding. Two 5 mm lower incisions made in each lower quadrant and canulas inserted under vision. Right tube grasped and right salpingectomy performed with Ligasure sealing/cutting device. Cut edge appeared hemostatic. Left tube and both the ovaries appeared normal. There were no adhesions or endometriosis.  Endobag placed from central port while 5 mm Laparoscope inserted from  LLQ port.Marland Kitchen. Specimen placed in endobag and it was pulled up at the umbilical incision, it was removed intact with Hassan canula and specimen with bag handed off. Hassan canula reinserted. Pelvic, abdomen visualized, Hemostasis noted. Appendix and liver normal. All cannulas removed, pneumperitoneum was released. Central incision fascia closed with 0-Vicryl and skin with 4-0 Vicryl. All the incisions were closed with Dermabond.  Foley removed. Hulka manipulator removed.   All counts were correct x2. Patient brought to PACU after extubation in stable condition. Surgical findings were discussed with patient and her husband. Discharge home.   I was scrubbed the entire time and performed the surgery.   V.Malyah Ohlrich, MD

## 2017-11-16 NOTE — Discharge Instructions (Signed)
Laparoscopy, Care After Refer to this sheet in the next few weeks. These instructions provide you with information about caring for yourself after your procedure. Your health care provider may also give you more specific instructions. Your treatment has been planned according to current medical practices, but problems sometimes occur. Call your health care provider if you have any problems or questions after your procedure. What can I expect after the procedure? After your procedure, it is common to have mild discomfort in the throat and abdomen. Follow these instructions at home:  Take over-the-counter and prescription medicines only as told by your health care provider.  Do not drive for 24 hours if you received a sedative.  Return to your normal activities as told by your health care provider.  Do not take baths, swim, or use a hot tub until your health care provider approves. You may shower.  Follow instructions from your health care provider about how to take care of your incision. Make sure you: ? Wash your hands with soap and water before you change your bandage (dressing). If soap and water are not available, use hand sanitizer. ? Change your dressing as told by your health care provider. ? Leave stitches (sutures), skin glue, or adhesive strips in place. These skin closures may need to stay in place for 2 weeks or longer. If adhesive strip edges start to loosen and curl up, you may trim the loose edges. Do not remove adhesive strips completely unless your health care provider tells you to do that.  Check your incision area every day for signs of infection. Check for: ? More redness, swelling, or pain. ? More fluid or blood. ? Warmth. ? Pus or a bad smell.  It is your responsibility to get the results of your procedure. Ask your health care provider or the department performing the procedure when your results will be ready. Contact a health care provider if:  There is new pain in  your shoulders.  You feel light-headed or faint.  You are unable to pass gas or unable to have a bowel movement.  You feel nauseous or you vomit.  You develop a rash.  You have more redness, swelling, or pain around your incision.  You have more fluid or blood coming from your incision.  Your incision feels warm to the touch.  You have pus or a bad smell coming from your incision.  You have a fever or chills. Get help right away if:  Your pain is getting worse.  You have ongoing vomiting.  The edges of your incision open up.  You have trouble breathing.  You have chest pain. This information is not intended to replace advice given to you by your health care provider. Make sure you discuss any questions you have with your health care provider. Document Released: 02/15/2015 Document Revised: 08/12/2015 Document Reviewed: 11/17/2014 Elsevier Interactive Patient Education  2018 [employer]. Ectopic Pregnancy An ectopic pregnancy happens when a fertilized egg grows outside the uterus. A pregnancy cannot live outside of the uterus. This problem often happens in the fallopian tube. It is often caused by damage to the fallopian tube. If this problem is found early, you may be treated with medicine. If your tube tears or bursts open (ruptures), you will bleed inside. This is an emergency. You will need surgery. Get help right away. What are the signs or symptoms? You may have normal pregnancy symptoms at first. These include:  Missing your period.  Feeling sick to your  stomach (nauseous).  Being tired.  Having tender breasts.  Then, you may start to have symptoms that are not normal. These include:  Pain with sex (intercourse).  Bleeding from the vagina. This includes light bleeding (spotting).  Belly (abdomen) or lower belly cramping or pain. This may be felt on one side.  A fast heartbeat (pulse).  Passing out (fainting) after going poop (bowel movement).  If  your tube tears, you may have symptoms such as:  Really bad pain in the belly or lower belly. This happens suddenly.  Dizziness.  Passing out.  Shoulder pain.  Get help right away if: You have any of these symptoms. This is an emergency. This information is not intended to replace advice given to you by your health care provider. Make sure you discuss any questions you have with your health care provider. Document Released: 06/02/2008 Document Revised: 08/12/2015 Document Reviewed: 10/16/2012 Elsevier Interactive Patient Education  2017 [employer]

## 2017-11-20 NOTE — Anesthesia Postprocedure Evaluation (Signed)
Anesthesia Post Note  Patient: Kara Roach  Procedure(s) Performed: LAPAROSCOPY OPERATIVE (N/A Abdomen) LAPAROSCOPIC UNILATERAL SALPINGECTOMY (Right )     Patient location during evaluation: PACU Anesthesia Type: General Level of consciousness: awake and alert Pain management: pain level controlled Vital Signs Assessment: post-procedure vital signs reviewed and stable Respiratory status: spontaneous breathing, nonlabored ventilation, respiratory function stable and patient connected to nasal cannula oxygen Cardiovascular status: blood pressure returned to baseline and stable Postop Assessment: no apparent nausea or vomiting Anesthetic complications: no    Last Vitals:  Vitals:   11/16/17 0130 11/16/17 0215  BP: (!) 119/54 (!) 114/56  Pulse: 99 87  Resp: 13 16  Temp:  37 C  SpO2: 100% 100%    Last Pain:  Vitals:   11/16/17 0215  TempSrc:   PainSc: 4    Pain Goal:                 Phillips Grout

## 2017-11-21 ENCOUNTER — Encounter (HOSPITAL_COMMUNITY): Payer: Self-pay | Admitting: Obstetrics & Gynecology

## 2018-09-07 ENCOUNTER — Encounter (HOSPITAL_COMMUNITY): Payer: Self-pay

## 2018-11-17 IMAGING — US US OB TRANSVAGINAL
1 series · 15 of 28 positions shown · non-contrast
Comparison: None.

CLINICAL DATA: Vaginal bleeding

EXAM:
TRANSVAGINAL OB ULTRASOUND
TECHNIQUE: Transvaginal ultrasound was performed for complete evaluation of the
gestation as well as the maternal uterus, adnexal regions, and
pelvic cul-de-sac.

[Series 1: us ob transvaginal · 29 acquisitions, 15 frames shown]
[im 1/29]
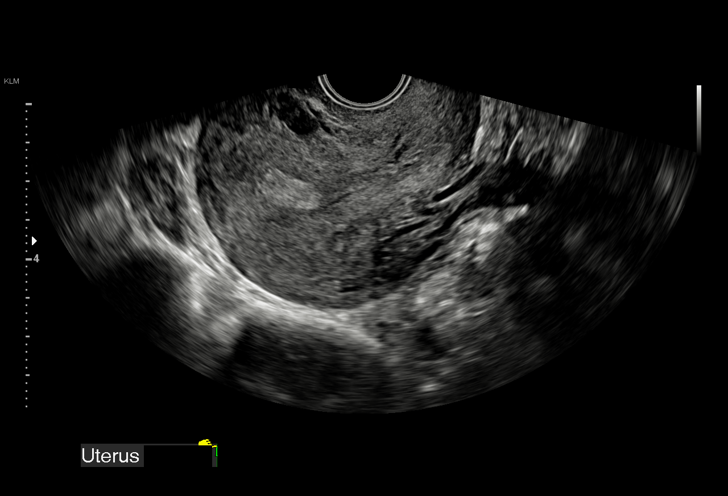
[im 3/29]
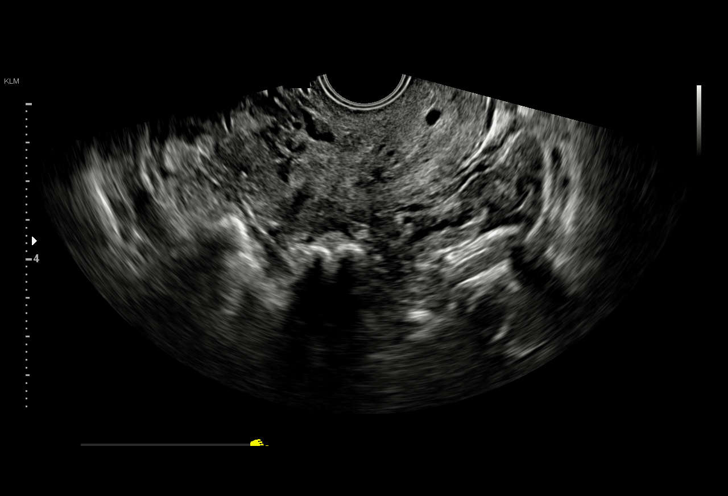
[im 5/29]
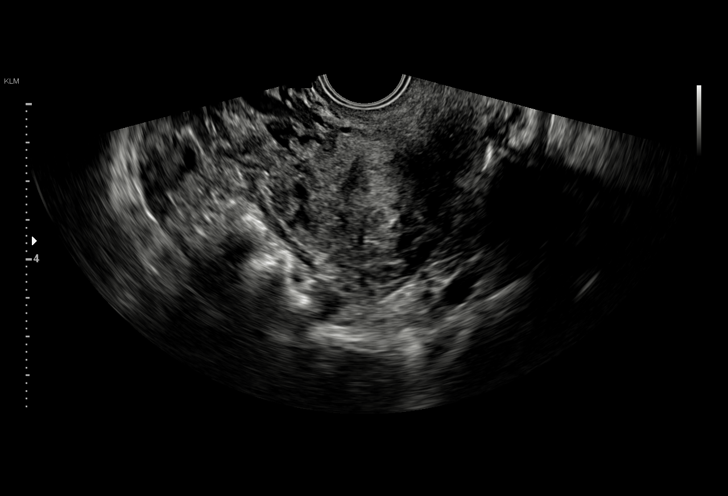
[im 7/29]
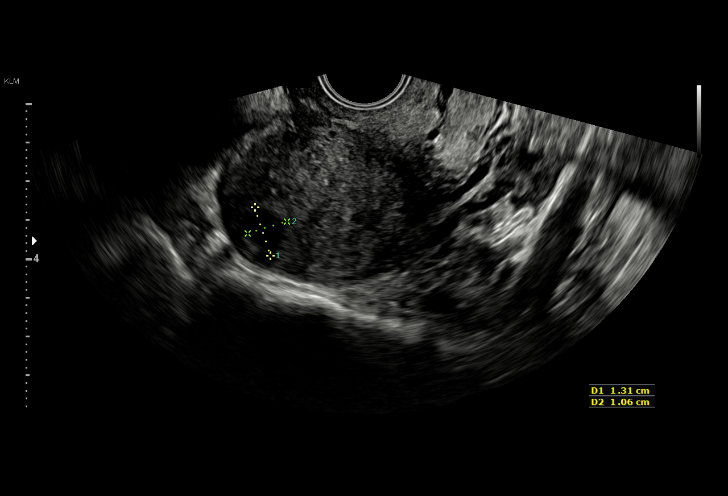
[im 9/29]
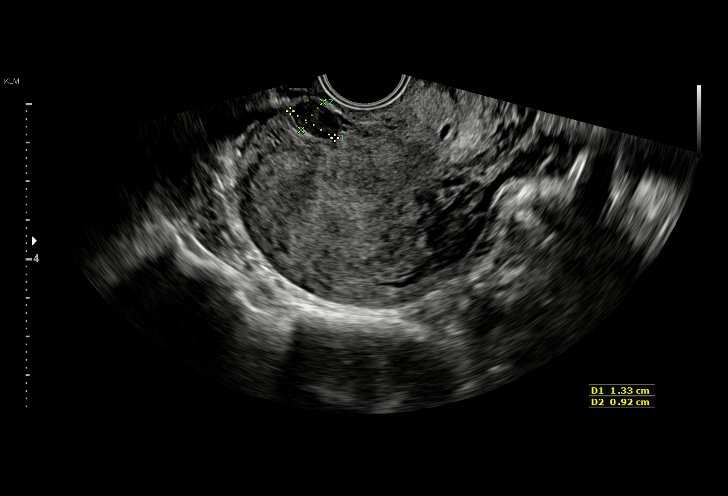
[im 11/29]
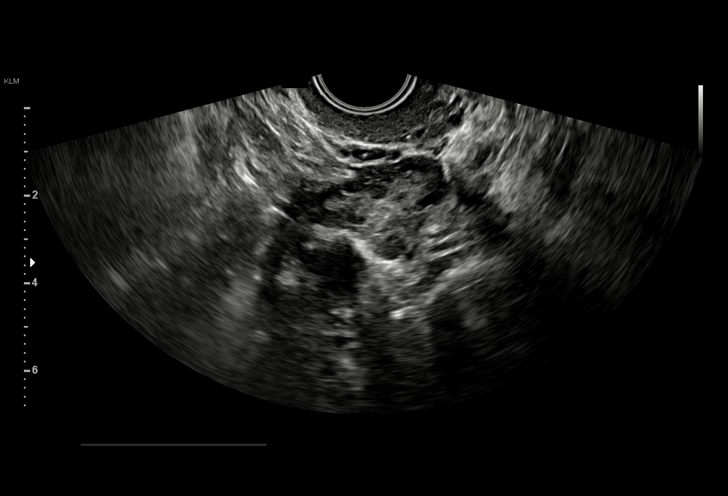
[im 13/29]
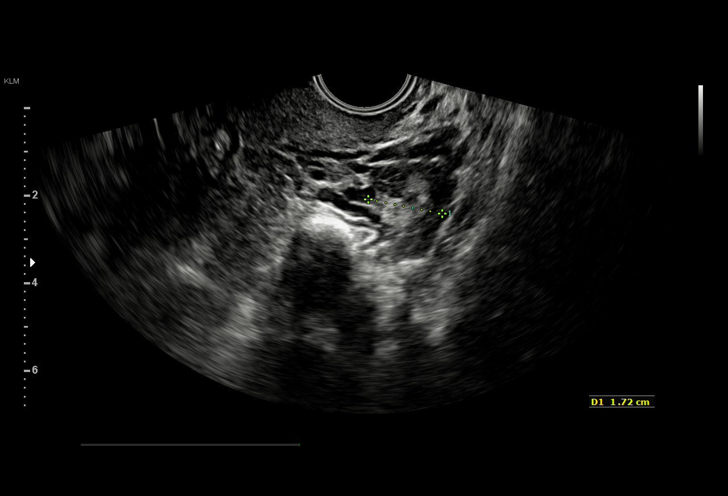
[im 15/29]
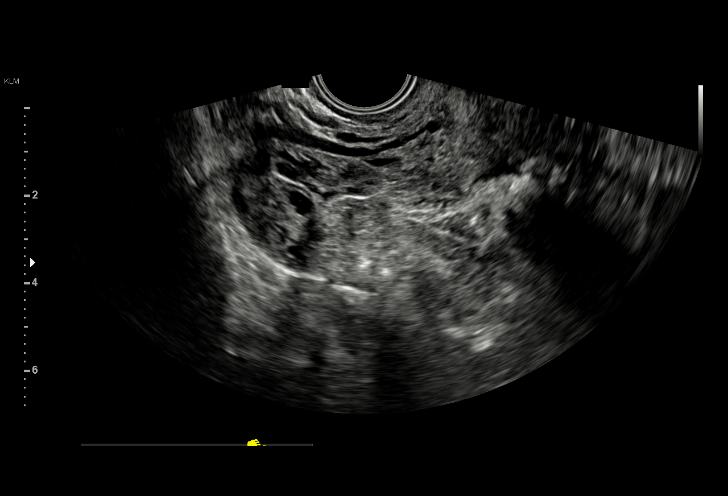
[im 16/29]
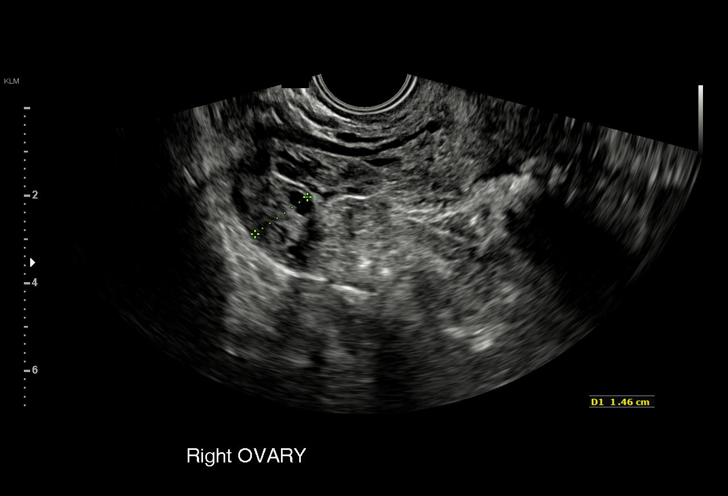
[im 18/29]
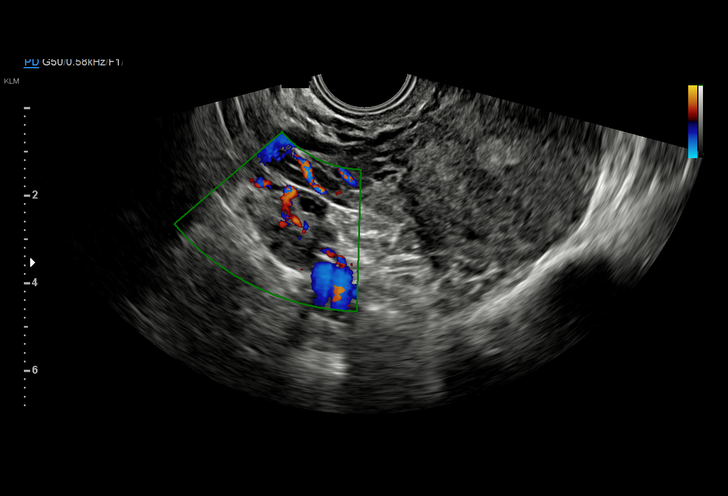
[im 20/29]
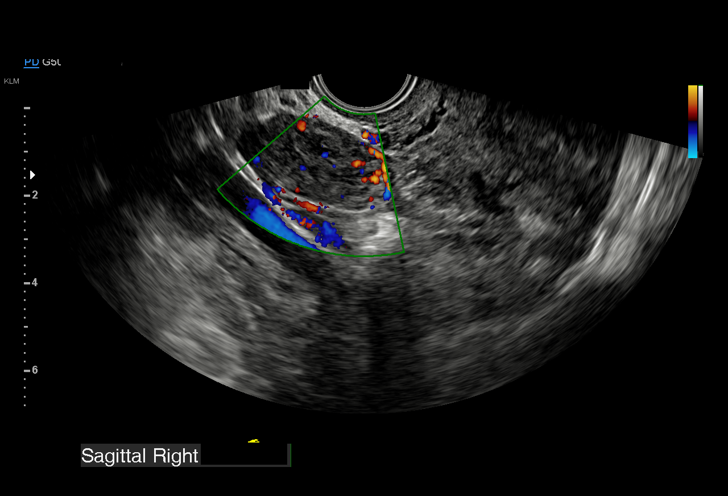
[im 22/29]
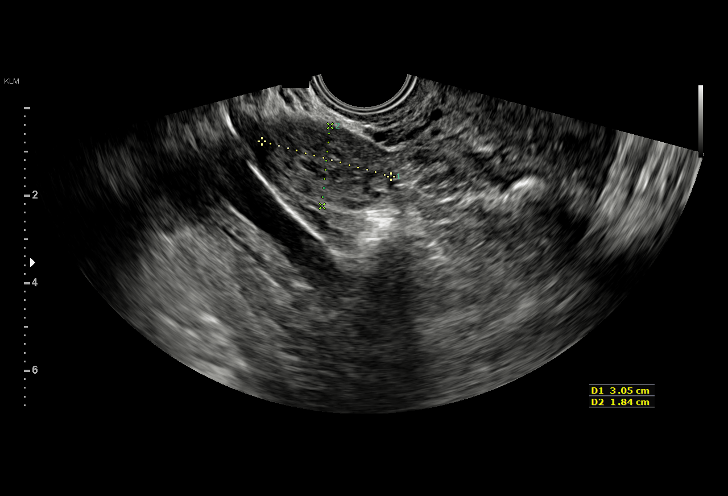
[im 24/29]
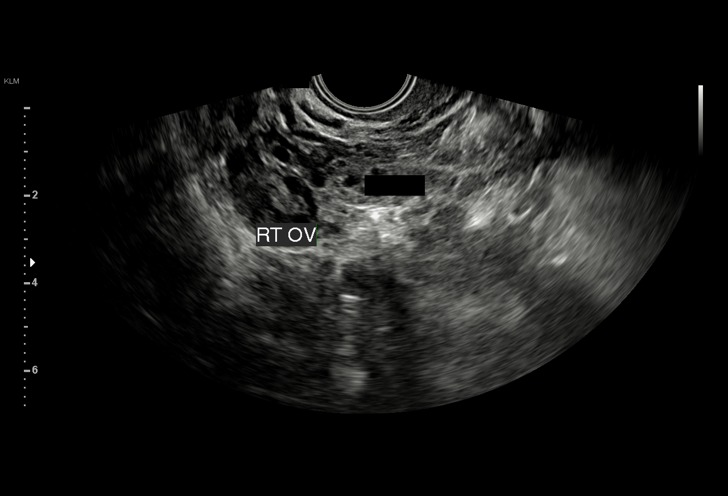
[im 26/29]
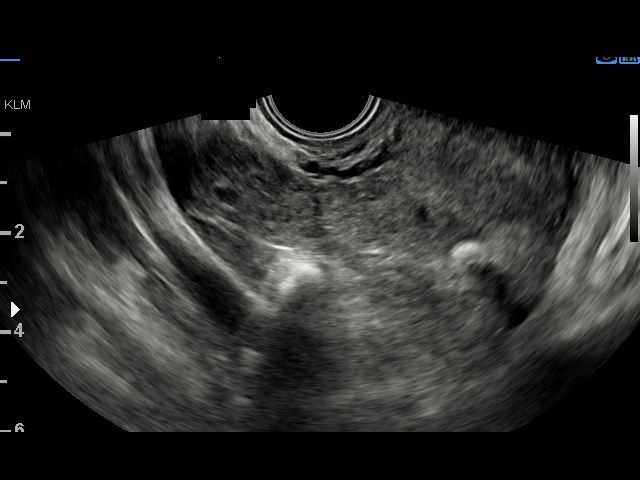
[im 29/29]
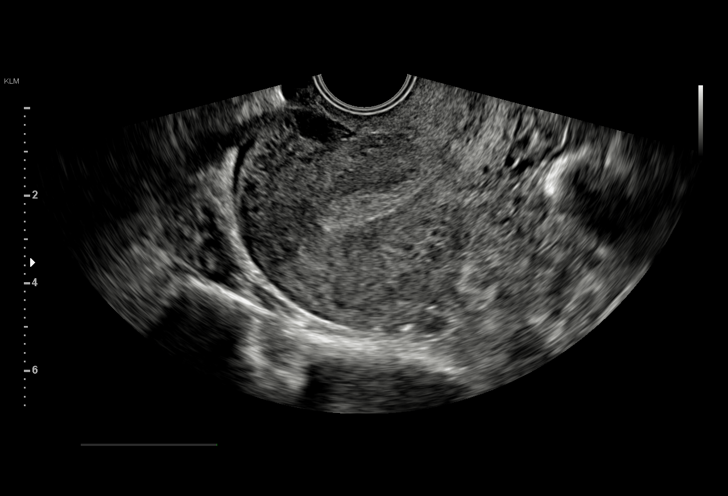

[15 of 28 positions shown; findings below may reference images not displayed]

FINDINGS: Intrauterine gestational sac: None

Yolk sac:  Not visualized

Embryo:  Not visualized

Cardiac Activity: Not visualized

Heart Rate:  bpm

MSD:   mm    w     d

CRL:     mm    w  d                  US EDC:

Subchorionic hemorrhage:  None visualized.

Maternal uterus/adnexae: Ovaries are unremarkable. No free fluid.
There is a soft tissue mass in the right adnexa separate from the
right ovary measuring 3.1 x 1.8 x 1.8 cm medial to the right ovary.
This is concerning for ectopic pregnancy.
IMPRESSION: No intrauterine gestation visualized. Soft tissue mass in the right
adnexa medial to the right ovary measures 3.1 x 1.8 x 1.8 cm
concerning for right ectopic pregnancy.

These results were called by telephone at the time of interpretation
on 11/15/2017 at [DATE] to Giti, who verbally acknowledged these
results.

## 2019-03-21 NOTE — L&D Delivery Note (Signed)
Delivery Note At 1:01 AM Kara viable female was delivered via Vaginal, Spontaneous (Presentation: Left Occiput Anterior) nuchal cord x4 reduced at the perineum.  APGAR: 8, 9; weight pending for skin to skin .   Placenta status: Spontaneous, Intact.  Cord: 3 vessels with the following complications: None.  Cord pH: n/Kara  Anesthesia: Epidural Episiotomy: None Lacerations: 2nd degree Suture Repair: 3.0 Est. Blood Loss (mL):  300  Mom to postpartum.  Baby to Couplet care / Skin to Skin.  Kara Roach Kara Roach 01/06/2020, 1:34 AM

## 2019-06-15 ENCOUNTER — Other Ambulatory Visit: Payer: Self-pay

## 2019-06-15 ENCOUNTER — Inpatient Hospital Stay (HOSPITAL_COMMUNITY)
Admission: AD | Admit: 2019-06-15 | Discharge: 2019-06-15 | Disposition: A | Discharge status: Home / Self Care | Payer: BC Managed Care – PPO | Source: Ambulatory Visit | Attending: Obstetrics and Gynecology | Admitting: Obstetrics and Gynecology

## 2019-06-15 ENCOUNTER — Encounter (HOSPITAL_COMMUNITY): Payer: Self-pay | Admitting: Obstetrics and Gynecology

## 2019-06-15 ENCOUNTER — Inpatient Hospital Stay (HOSPITAL_COMMUNITY): Payer: BC Managed Care – PPO

## 2019-06-15 DIAGNOSIS — O468X1 Other antepartum hemorrhage, first trimester: Secondary | ICD-10-CM | POA: Diagnosis not present

## 2019-06-15 DIAGNOSIS — O209 Hemorrhage in early pregnancy, unspecified: Secondary | ICD-10-CM

## 2019-06-15 DIAGNOSIS — O208 Other hemorrhage in early pregnancy: Secondary | ICD-10-CM | POA: Diagnosis not present

## 2019-06-15 DIAGNOSIS — Z880 Allergy status to penicillin: Secondary | ICD-10-CM | POA: Insufficient documentation

## 2019-06-15 DIAGNOSIS — O418X10 Other specified disorders of amniotic fluid and membranes, first trimester, not applicable or unspecified: Secondary | ICD-10-CM

## 2019-06-15 DIAGNOSIS — Z3A08 8 weeks gestation of pregnancy: Secondary | ICD-10-CM | POA: Diagnosis not present

## 2019-06-15 LAB — URINALYSIS, ROUTINE W REFLEX MICROSCOPIC
Bilirubin Urine: NEGATIVE
Glucose, UA: NEGATIVE mg/dL
Ketones, ur: NEGATIVE mg/dL
Nitrite: NEGATIVE
Protein, ur: NEGATIVE mg/dL
Specific Gravity, Urine: 1.026 (ref 1.005–1.030)
pH: 5 (ref 5.0–8.0)

## 2019-06-15 LAB — CBC
HCT: 37.7 % (ref 36.0–46.0)
Hemoglobin: 12.6 g/dL (ref 12.0–15.0)
MCH: 27.5 pg (ref 26.0–34.0)
MCHC: 33.4 g/dL (ref 30.0–36.0)
MCV: 82.3 fL (ref 80.0–100.0)
Platelets: 321 10*3/uL (ref 150–400)
RBC: 4.58 MIL/uL (ref 3.87–5.11)
RDW: 13.4 % (ref 11.5–15.5)
WBC: 9.5 10*3/uL (ref 4.0–10.5)
nRBC: 0 % (ref 0.0–0.2)

## 2019-06-15 LAB — POCT PREGNANCY, URINE: Preg Test, Ur: POSITIVE — AB

## 2019-06-15 LAB — HCG, QUANTITATIVE, PREGNANCY: hCG, Beta Chain, Quant, S: 170815 m[IU]/mL — ABNORMAL HIGH (ref <5)

## 2019-06-15 NOTE — Discharge Instructions (Signed)

## 2019-06-15 NOTE — MAU Provider Note (Signed)
History     CSN: [id number]  Arrival date and time: 06/15/19 1623   First Provider Initiated Contact with Patient 06/15/19 1814      Chief Complaint  Patient presents with  . Vaginal Bleeding   HPI Kara Roach is a 34 y.o. E3P2951 at [redacted]w[redacted]d who presents to MAU with chief complaint of vaginal bleeding. This is a new problem, onset today. Patient states she has visualized a small amount of brownish red discharge on her pad for the past hour. She denies abdominal pain, abdominal tenderness, fever or recent illness. She is remote from sexual intercourse.  Patient has initiated prenatal care due to history of ectopic pregnancy. She endorses normal ultrasound in clinic with confirmed IUP and cardiac flicker.  She receives care with [facility] (Dr. Benjie Karvonen primary).  OB History    Gravida  4   Para  1   Term  1   Preterm      AB  2   Living  1     SAB  1   TAB      Ectopic  1   Multiple      Live Births  1           Past Medical History:  Diagnosis Date  . Ectopic pregnancy without intrauterine pregnancy 11/15/2017  . Medical history non-contributory     Past Surgical History:  Procedure Laterality Date  . LAPAROSCOPIC UNILATERAL SALPINGECTOMY Right 11/15/2017   Procedure: LAPAROSCOPIC UNILATERAL SALPINGECTOMY;  Surgeon: Azucena Fallen, MD;  Location: [facility];  Service: Gynecology;  Laterality: Right;  . LAPAROSCOPY N/A 11/15/2017   Procedure: LAPAROSCOPY OPERATIVE;  Surgeon: Azucena Fallen, MD;  Location: [facility];  Service: Gynecology;  Laterality: N/A;  . NO PAST SURGERIES      Family History  Problem Relation Age of Onset  . Diabetes Maternal Grandmother     Social History   Tobacco Use  . Smoking status: Never Smoker  . Smokeless tobacco: Never Used  Substance Use Topics  . Alcohol use: No  . Drug use: No    Allergies:  Allergies  Allergen Reactions  . Penicillins     Abdominal pain     Medications Prior to Admission  Medication Sig  Dispense Refill Last Dose  . acetaminophen (TYLENOL) 500 MG tablet Take 1 tablet (500 mg total) by mouth every 6 (six) hours as needed. 30 tablet 0   . ibuprofen (ADVIL,MOTRIN) 800 MG tablet Take 1 tablet (800 mg total) by mouth every 8 (eight) hours as needed. 45 tablet 0   . oxyCODONE-acetaminophen (PERCOCET/ROXICET) 5-325 MG tablet Take 1 tablet by mouth every 6 (six) hours as needed for severe pain. 15 tablet 0     Review of Systems  Respiratory: Negative for shortness of breath.   Gastrointestinal: Negative for abdominal pain.  Genitourinary: Positive for vaginal bleeding. Negative for dysuria.  Musculoskeletal: Negative for back pain.  All other systems reviewed and are negative.  Physical Exam   Blood pressure (!) 108/57, pulse 75, temperature 99.2 F (37.3 C), temperature source Oral, resp. rate 16, height 5\' 2"  (1.575 m), weight 61.9 kg, SpO2 99 %, unknown if currently breastfeeding.  Physical Exam  Nursing note and vitals reviewed. Constitutional: She is oriented to person, place, and time. She appears well-developed and well-nourished.  Cardiovascular: Normal rate and normal heart sounds.  Respiratory: Effort normal and breath sounds normal.  GI: Soft. Bowel sounds are normal. She exhibits no distension. There is no abdominal tenderness. There  is no rebound, no guarding and no CVA tenderness.  Genitourinary:    Vaginal discharge present.     Genitourinary Comments: Pelvic exam: External genitalia normal, vaginal walls pink and well rugated, cervix visually closed, no lesions noted. Trace amounts of dark red discharge. Bimanual: deferred due to patient anxiety, tearfulness   Neurological: She is alert and oriented to person, place, and time.  Skin: Skin is warm and dry.  Psychiatric: She has a normal mood and affect. Her behavior is normal. Judgment and thought content normal.    MAU Course/MDM  Procedures  Patient Vitals for the past 24 hrs:  BP Temp Temp src Pulse Resp  SpO2 Height Weight  06/15/19 1836 (!) 113/56 -- -- 70 15 99 % -- --  06/15/19 1646 (!) 108/57 99.2 F (37.3 C) Oral 75 16 99 % 5\' 2"  (1.575 m) 61.9 kg   Results for orders placed or performed during the hospital encounter of 06/15/19 (from the past 24 hour(s))  Urinalysis, Routine w reflex microscopic     Status: Abnormal   Collection Time: 06/15/19  4:40 PM  Result Value Ref Range   Color, Urine YELLOW YELLOW   APPearance HAZY (A) CLEAR   Specific Gravity, Urine 1.026 1.005 - 1.030   pH 5.0 5.0 - 8.0   Glucose, UA NEGATIVE NEGATIVE mg/dL   Hgb urine dipstick LARGE (A) NEGATIVE   Bilirubin Urine NEGATIVE NEGATIVE   Ketones, ur NEGATIVE NEGATIVE mg/dL   Protein, ur NEGATIVE NEGATIVE mg/dL   Nitrite NEGATIVE NEGATIVE   Leukocytes,Ua MODERATE (A) NEGATIVE   RBC / HPF 6-10 0 - 5 RBC/hpf   WBC, UA 6-10 0 - 5 WBC/hpf   Bacteria, UA RARE (A) NONE SEEN   Squamous Epithelial / LPF 6-10 0 - 5   Mucus PRESENT   Pregnancy, urine POC     Status: Abnormal   Collection Time: 06/15/19  4:41 PM  Result Value Ref Range   Preg Test, Ur POSITIVE (A) NEGATIVE  CBC     Status: None   Collection Time: 06/15/19  5:19 PM  Result Value Ref Range   WBC 9.5 4.0 - 10.5 K/uL   RBC 4.58 3.87 - 5.11 MIL/uL   Hemoglobin 12.6 12.0 - 15.0 g/dL   HCT 06/17/19 19.3 - 79.0 %   MCV 82.3 80.0 - 100.0 fL   MCH 27.5 26.0 - 34.0 pg   MCHC 33.4 30.0 - 36.0 g/dL   RDW 24.0 97.3 - 53.2 %   Platelets 321 150 - 400 K/uL   nRBC 0.0 0.0 - 0.2 %  hCG, quantitative, pregnancy     Status: Abnormal   Collection Time: 06/15/19  5:19 PM  Result Value Ref Range   hCG, Beta Chain, Quant, S 170,815 (H) <5 mIU/mL   06/17/19 OB LESS THAN 14 WEEKS WITH OB TRANSVAGINAL  Result Date: 06/15/2019 CLINICAL DATA:  34 year old pregnant female presents with vaginal bleeding. Quantitative beta HCG pending. EDC by LMP: 01/22/2020, projecting to an expected gestational age of [redacted] weeks 3 days. EXAM: OBSTETRIC <14 WK 13/06/2019 AND TRANSVAGINAL OB US  TECHNIQUE: Both transabdominal and transvaginal ultrasound examinations were performed for complete evaluation of the gestation as well as the maternal uterus, adnexal regions, and pelvic cul-de-sac. Transvaginal technique was performed to assess early pregnancy. COMPARISON:  No prior scans from this gestation. FINDINGS: Intrauterine gestational sac: Single Yolk sac:  Visualized. Fetus: Visualized. Fetal Cardiac Activity: Visualized. Fetal Heart Rate: 169 bpm CRL:  20.3 mm   8 w  4 d                  [place] EDC: 01/21/2020 Subchorionic hemorrhage: Small perigestational bleed involving less than 30% of the gestational sac circumference. Maternal uterus/adnexae: Intramural 2.5 x 1.9 x 2.1 cm posterior right uterine body fibroid. Intramural 1.7 x 1.1 x 2.0 cm midline posterior uterine body fibroid. No abnormal free fluid. Left ovary measures 3.5 x 2.3 x 2.6 cm and contains a corpus luteum. Right ovary measures 3.0 x 1.7 x 1.5 cm. No abnormal ovarian or adnexal masses. IMPRESSION: 1. Single living intrauterine gestation at 8 weeks 4 days by crown-rump length, concordant with provided menstrual dating. 2. Small perigestational bleed.  Normal fetal cardiac activity. 3. Posterior uterine fibroids as detailed. 4. Normal ovaries.  No adnexal masses.  No abnormal free fluid. Electronically Signed   By: Delbert Phenix M.D.   On: 06/15/2019 18:07   Assessment and Plan  --35 y.o. G4P1021 at [redacted]w[redacted]d c/w LMP --Small perigestational bleed, advised pelvic rest and falls precautions --Blood type O POS --Abnormal UA, urine culture in work --Discharge home in stable condition  Calvert Cantor, CNM 06/15/2019, 7:30 PM

## 2019-06-15 NOTE — MAU Note (Signed)
Kara Roach is a 34 y.o. at [redacted]w[redacted]d here in MAU reporting: started bleeding today. It is brown, sees a small amount on a pad. No recent IC. No pain.  Has seen Dr Juliene Pina in the office, has had some blood work done and an u/s. States everything with the u/s looked good and they saw cardiac activity.   Onset of complaint: today  Pain score: 0/10  Vitals:   06/15/19 1646  BP: (!) 108/57  Pulse: 75  Resp: 16  Temp: 99.2 F (37.3 C)  SpO2: 99%     Lab orders placed from triage: UA, UPT

## 2019-06-16 LAB — CULTURE, OB URINE

## 2019-07-03 LAB — OB RESULTS CONSOLE RUBELLA ANTIBODY, IGM: Rubella: IMMUNE

## 2019-07-03 LAB — OB RESULTS CONSOLE HEPATITIS B SURFACE ANTIGEN: Hepatitis B Surface Ag: NEGATIVE

## 2019-07-03 LAB — OB RESULTS CONSOLE HIV ANTIBODY (ROUTINE TESTING): HIV: NONREACTIVE

## 2019-12-29 ENCOUNTER — Observation Stay (HOSPITAL_COMMUNITY)
Admission: AD | Admit: 2019-12-29 | Discharge: 2019-12-30 | Disposition: A | Discharge status: Home / Self Care | Payer: BC Managed Care – PPO | Attending: Obstetrics & Gynecology | Admitting: Obstetrics & Gynecology

## 2019-12-29 ENCOUNTER — Other Ambulatory Visit: Payer: Self-pay

## 2019-12-29 ENCOUNTER — Inpatient Hospital Stay (HOSPITAL_BASED_OUTPATIENT_CLINIC_OR_DEPARTMENT_OTHER): Payer: BC Managed Care – PPO

## 2019-12-29 ENCOUNTER — Encounter (HOSPITAL_COMMUNITY): Payer: Self-pay | Admitting: Obstetrics & Gynecology

## 2019-12-29 DIAGNOSIS — Z3A36 36 weeks gestation of pregnancy: Secondary | ICD-10-CM | POA: Diagnosis not present

## 2019-12-29 DIAGNOSIS — Z20822 Contact with and (suspected) exposure to covid-19: Secondary | ICD-10-CM | POA: Insufficient documentation

## 2019-12-29 DIAGNOSIS — O368330 Maternal care for abnormalities of the fetal heart rate or rhythm, third trimester, not applicable or unspecified: Secondary | ICD-10-CM

## 2019-12-29 DIAGNOSIS — O368390 Maternal care for abnormalities of the fetal heart rate or rhythm, unspecified trimester, not applicable or unspecified: Secondary | ICD-10-CM | POA: Diagnosis present

## 2019-12-29 DIAGNOSIS — O368130 Decreased fetal movements, third trimester, not applicable or unspecified: Principal | ICD-10-CM | POA: Insufficient documentation

## 2019-12-29 DIAGNOSIS — O368190 Decreased fetal movements, unspecified trimester, not applicable or unspecified: Secondary | ICD-10-CM

## 2019-12-29 LAB — CBC
HCT: 35.1 % — ABNORMAL LOW (ref 36.0–46.0)
Hemoglobin: 10.7 g/dL — ABNORMAL LOW (ref 12.0–15.0)
MCH: 23.9 pg — ABNORMAL LOW (ref 26.0–34.0)
MCHC: 30.5 g/dL (ref 30.0–36.0)
MCV: 78.3 fL — ABNORMAL LOW (ref 80.0–100.0)
Platelets: 232 10*3/uL (ref 150–400)
RBC: 4.48 MIL/uL (ref 3.87–5.11)
RDW: 15.1 % (ref 11.5–15.5)
WBC: 8.7 10*3/uL (ref 4.0–10.5)
nRBC: 0 % (ref 0.0–0.2)

## 2019-12-29 LAB — RESPIRATORY PANEL BY RT PCR (FLU A&B, COVID)
Influenza A by PCR: NEGATIVE
Influenza B by PCR: NEGATIVE
SARS Coronavirus 2 by RT PCR: NEGATIVE

## 2019-12-29 LAB — TYPE AND SCREEN
ABO/RH(D): O POS
Antibody Screen: NEGATIVE

## 2019-12-29 LAB — OB RESULTS CONSOLE GBS: GBS: NEGATIVE

## 2019-12-29 MED ORDER — BETAMETHASONE SOD PHOS & ACET 6 (3-3) MG/ML IJ SUSP
12.0000 mg | INTRAMUSCULAR | Status: AC
  Administered 2019-12-29 – 2019-12-30 (×2): 12 mg via INTRAMUSCULAR
  Filled 2019-12-29: qty 5

## 2019-12-29 MED ORDER — DOCUSATE SODIUM 100 MG PO CAPS: 100.0000 mg | ORAL_CAPSULE | Freq: Every day | ORAL | Status: DC

## 2019-12-29 MED ORDER — LACTATED RINGERS IV SOLN: INTRAVENOUS | Status: DC

## 2019-12-29 MED ORDER — CALCIUM CARBONATE ANTACID 500 MG PO CHEW: 2.0000 | CHEWABLE_TABLET | ORAL | Status: DC | PRN

## 2019-12-29 MED ORDER — PRENATAL MULTIVITAMIN CH: 1.0000 | ORAL_TABLET | Freq: Every day | ORAL | Status: DC

## 2019-12-29 MED ORDER — ACETAMINOPHEN 325 MG PO TABS: 650.0000 mg | ORAL_TABLET | ORAL | Status: DC | PRN

## 2019-12-29 MED ORDER — LACTATED RINGERS IV BOLUS
1000.0000 mL | Freq: Once | INTRAVENOUS | Status: AC
  Administered 2019-12-29: 1000 mL via INTRAVENOUS

## 2019-12-29 NOTE — MAU Provider Note (Signed)
History     CSN: [id number]  Arrival date and time: 12/29/19 8938   First Provider Initiated Contact with Patient 12/29/19 1019      Chief Complaint  Patient presents with   Decreased Fetal Movement   HPI This is a 34 year old G4 P1-0-2-1 at [redacted]w[redacted]d.  Pregnancy has been uncomplicated.  She is followed by Dr. Juliene Pina at [facility].  Patient reports not feeling the baby move this morning.  She was seen at her doctor's office.  NST there showed no accelerations decelerations and she was sent over here for evaluation.  Denies vaginal bleeding, leaking fluid, fevers, chills, nausea, vomiting.   OB History    Gravida  4   Para  1   Term  1   Preterm      AB  2   Living  1     SAB  1   TAB      Ectopic  1   Multiple      Live Births  1           Past Medical History:  Diagnosis Date   Ectopic pregnancy without intrauterine pregnancy 11/15/2017   Medical history non-contributory     Past Surgical History:  Procedure Laterality Date   LAPAROSCOPIC UNILATERAL SALPINGECTOMY Right 11/15/2017   Procedure: LAPAROSCOPIC UNILATERAL SALPINGECTOMY;  Surgeon: Shea Evans, MD;  Location: [facility];  Service: Gynecology;  Laterality: Right;   LAPAROSCOPY N/A 11/15/2017   Procedure: LAPAROSCOPY OPERATIVE;  Surgeon: Shea Evans, MD;  Location: [facility];  Service: Gynecology;  Laterality: N/A;   NO PAST SURGERIES      Family History  Problem Relation Age of Onset   Diabetes Maternal Grandmother     Social History   Tobacco Use   Smoking status: Never Smoker   Smokeless tobacco: Never Used  Vaping Use   Vaping Use: Never used  Substance Use Topics   Alcohol use: No   Drug use: No    Allergies:  Allergies  Allergen Reactions   Penicillins     Abdominal pain     Medications Prior to Admission  Medication Sig Dispense Refill Last Dose   Prenatal Vit-Fe Fumarate-FA (MULTIVITAMIN-PRENATAL) 27-0.8 MG TABS tablet Take 1 tablet by mouth daily at  12 noon.   12/28/2019 at Unknown time   acetaminophen (TYLENOL) 500 MG tablet Take 1 tablet (500 mg total) by mouth every 6 (six) hours as needed. 30 tablet 0     Review of Systems Physical Exam   Blood pressure 97/70, pulse (!) 101, temperature 98.5 F (36.9 C), resp. rate 20, height 5\' 2"  (1.575 m), weight 71.8 kg, SpO2 99 %, unknown if currently breastfeeding.  Physical Exam Vitals reviewed.  Constitutional:      Appearance: Normal appearance.  Cardiovascular:     Rate and Rhythm: Normal rate and regular rhythm.     Pulses: Normal pulses.  Pulmonary:     Effort: Pulmonary effort is normal.  Abdominal:     General: There is no distension.     Palpations: There is no mass.     Tenderness: There is no abdominal tenderness.     Hernia: No hernia is present.     Comments: Gravid approximately to dates.  Contraction palpated  Skin:    Capillary Refill: Capillary refill takes less than 2 seconds.  Neurological:     General: No focal deficit present.     Mental Status: She is alert and oriented to person, place, and time.  Psychiatric:  Mood and Affect: Mood normal.        Behavior: Behavior normal.        Thought Content: Thought content normal.        Judgment: Judgment normal.    Dilation: Closed Effacement (%): Thick Station: -3 Exam by:: Dr. Adrian Blackwater   No results found.   MAU Course  Procedures FHT: baseline 135, mod variability, +accel, multiple variable decels.   MDM BPP 8/8.  IVF given. Baby starting to move.  Is having contractions - cervix rechecked and unchanged.  Assessment and Plan   1. [redacted] week gestation 2. Non reassuring FHT  BMZ given. Admit to Dr Juliene Pina.   Levie Heritage, DO 12/29/2019, 10:20 AM

## 2019-12-29 NOTE — H&P (Signed)
Kara Roach is a 34 y.o. female G4P1021 at 36.3 wks EDC 01/22/20, presenting for decreased FMs, was seen in office and reported decreased FMs since 5 days but not feeling much today. She came for her scheduled Ob visit, didn't call us on the weekend.  Also reports lower abdominal pain. No LOF or vag bleeding.  [facility]- Dr Darian Ace/ [facility]. AMA- nl NIPS Boy, S>D, growth LGA.  Prior SVD- 2014, 7'9"  G1- SAB, G2- term girl, G3- ectopic.  OB History    Gravida  4   Para  1   Term  1   Preterm      AB  2   Living  1     SAB  1   TAB      Ectopic  1   Multiple      Live Births  1          Past Medical History:  Diagnosis Date  . Ectopic pregnancy without intrauterine pregnancy 11/15/2017  . Medical history non-contributory    Past Surgical History:  Procedure Laterality Date  . LAPAROSCOPIC UNILATERAL SALPINGECTOMY Right 11/15/2017   Procedure: LAPAROSCOPIC UNILATERAL SALPINGECTOMY;  Surgeon: Shea Evans, MD;  Location: [facility];  Service: Gynecology;  Laterality: Right;  . LAPAROSCOPY N/A 11/15/2017   Procedure: LAPAROSCOPY OPERATIVE;  Surgeon: Shea Evans, MD;  Location: [facility];  Service: Gynecology;  Laterality: N/A;  . NO PAST SURGERIES     Family History: family history includes Diabetes in her maternal grandmother. Social History:  reports that she has never smoked. She has never used smokeless tobacco. She reports that she does not drink alcohol and does not use drugs.     Maternal Diabetes: No Genetic Screening: Normal NIPS Maternal Ultrasounds/Referrals: Normal Fetal Ultrasounds or other Referrals:  None Maternal Substance Abuse:  No Significant Maternal Medications:  None Significant Maternal Lab Results:  Other: GBS sent from office on 12/29/19 Other Comments:  None  Review of Systems History Dilation: Closed Effacement (%): Thick Station: -3 Exam by:: Dr. Adrian Blackwater Blood pressure 97/70, pulse (!) 101, temperature 98.5 F (36.9 C), resp. rate  20, height 5\' 2"  (1.575 m), weight 71.8 kg, SpO2 99 %, unknown if currently breastfeeding. Exam Physical Exam  BP (!) 108/55 (BP Location: Left Arm)   Pulse 92   Temp 98.2 F (36.8 C) (Oral)   Resp 18   Ht 5\' 2"  (1.575 m)   Wt 71.8 kg   SpO2 98%   BMI 28.94 kg/m  Physical exam:  A&O x 3, no acute distress. Pleasant HEENT neg, no thyromegaly Lungs CTA bilat CV RRR, A1S2 normal Abdo soft, non tender, non acute Extr no edema/ tenderness Pelvic Cx closed, long. High Vx FHT  130s , accels noted in hospital, rare variable decels. Mod variability- cat I Toco q 3-5 min, no pain per pt   Prenatal labs: ABO, Rh:  O (+) Antibody:  Neg Rubella:  Imm RPR:   NR HBsAg:   Neg HIV:   Neg GBS:   pending Glucola nl NIPS - low risk, XY  Assessment/Plan: 34 yo, G4P1021, at 36.3 wks with decreased FMs, NT NST in office with decels and no accels and pt reporting no FMs this morning, so sent to MAU. In MAU FHT improved to cat I. BPP 8/8, nl AFI at 12 cm, Double nuchal cord reported per MFM but advised no indication for delivery. Admit for observation, CEFM and NPO for few hours to assess no recurrent decels If NST  reactive overnight and pt reports normal FMs, will discharge in AM with weekly NST and IOL 39 wks.   Robley Fries 12/29/2019, 1:06 PM

## 2019-12-29 NOTE — MAU Note (Signed)
Sent from MD office for fetal monitoring.  Reports decreased FM last week and no movement today.  Reports still no fetal movement.  Hasn't eaten breakfast, only sips of coffee.  Denies VB or LOF.

## 2019-12-30 DIAGNOSIS — O368130 Decreased fetal movements, third trimester, not applicable or unspecified: Secondary | ICD-10-CM | POA: Diagnosis not present

## 2019-12-30 LAB — RPR: RPR Ser Ql: NONREACTIVE

## 2019-12-30 NOTE — Discharge Summary (Signed)
Physician Discharge Summary  Patient ID: Kara Roach MRN: [id number] DOB/AGE: 17-Feb-1986 34 y.o.  Admit date: 12/29/2019 Discharge date: 12/30/2019  Admission Diagnoses: Decreased fetal movements, non-reactive office NST with spontaneous decelerations.   Discharge Diagnoses:  Active Problems:   Fetal heart rate decelerations affecting management of mother Improved fetal status  BPP 8/8 but double nuchal cord noted but improved fetal movements and reactive NST  Discharged Condition: good  Hospital Course: MAU noted reactive NST but due to decreased fetal movements, BPP done- noted 8/8, AFI 12 cm but double nuchal cord noted. MFM advised monitoring for observation but no indication for delivery BMTZ x 2 doses during this admission  Discharge Exam: Blood pressure (!) 108/48, pulse 90, temperature 98.4 F (36.9 C), temperature source Oral, resp. rate 18, height 5\' 2"  (1.575 m), weight 71.8 kg, SpO2 100 %, unknown if currently breastfeeding.  36.5 wks O: BP (!) 108/48 (BP Location: Right Arm)   Pulse 90   Temp 98.4 F (36.9 C) (Oral)   Resp 18   Ht 5\' 2"  (1.575 m)   Wt 71.8 kg   SpO2 100%   BMI 28.94 kg/m  Abdo soft FHT 130s + accels no decels mod variability, cat I/ reactive NST. Toco- intermittent, but mild per pt  Cx- was closed yesterday, repeat exam deferred  Noted double nuchal cord, discussed, no urgent indication for delivery esp since normal FMs noted but FAC discussed.  Considering pt feels nl FAC and NST reactive, repeat BPP/ MCA deferred (per MFM guidance)   #2 BTMZ before discharge  F/up in office 10/15 for NST. Has growth sono in office 10/20, will add NST to visit. Care and warning signs discussed.    Disposition: Discharge disposition: 01-Home or Self Care       Discharge Instructions    Diet - low sodium heart healthy   Complete by: As directed    Discharge instructions   Complete by: As directed    Strict fetal kick counts three times  daily. Labor signs discussed.   Increase activity slowly   Complete by: As directed      Allergies as of 12/30/2019      Reactions   Penicillins Other (See Comments)   Abdominal pain      Medication List    TAKE these medications   multivitamin-prenatal 27-0.8 MG Tabs tablet Take 1 tablet by mouth daily at 12 noon.        Signed11/20 12/30/2019, 9:53 AM

## 2019-12-30 NOTE — Progress Notes (Signed)
Pt discharged after discharge instructions given. 2nd dose BMZ given per Dr. Camillia Herter request. All questions answered. IV discontinued. Pt in stable condition and sent home with all belongings at discharge.

## 2019-12-30 NOTE — Progress Notes (Signed)
Pt stated she saw dark brown discharge  On the tissue when she wiped in the the bathrrom this morning. RN notified.

## 2019-12-30 NOTE — Progress Notes (Signed)
Patient ID: Kara Roach, female   DOB: [birth year], 34 y.o.   MRN: [id number] HD #2 36.5 wks  Admission for no FMs per pt and NR NST and decels in office. Since admission rare decels, + accels and BPP 8/8- reassuring fetal status. Pt reports active FMs since getting IV hydration. Has UCs but not painful. No VB/ LOF.  O: BP (!) 108/48 (BP Location: Right Arm)   Pulse 90   Temp 98.4 F (36.9 C) (Oral)   Resp 18   Ht 5\' 2"  (1.575 m)   Wt 71.8 kg   SpO2 100%   BMI 28.94 kg/m  Abdo soft FHT 130s + accels no decels mod variability, cat I/ reactive NST. Toco- intermittent, but mild per pt  Cx- was closed yesterday, repeat exam deferred  Noted double nuchal cord, discussed, no urgent indication for delivery esp since normal FMs noted but FAC discussed.  Considering pt feels nl FAC and NST reactive, repeat BPP/ MCA deferred (per MFM guidance)   #2 BTMZ before discharge  F/up in office 10/15 for NST. Has growth sono in office 10/20, will add NST to visit. Care and warning signs discussed.   --V.11/20, MD

## 2020-01-02 ENCOUNTER — Other Ambulatory Visit: Payer: Self-pay

## 2020-01-02 ENCOUNTER — Inpatient Hospital Stay (EMERGENCY_DEPARTMENT_HOSPITAL)
Admission: AD | Admit: 2020-01-02 | Discharge: 2020-01-02 | Disposition: A | Discharge status: Home / Self Care | Payer: BC Managed Care – PPO | Source: Home / Self Care | Attending: Obstetrics and Gynecology | Admitting: Obstetrics and Gynecology

## 2020-01-02 ENCOUNTER — Encounter (HOSPITAL_COMMUNITY): Payer: Self-pay | Admitting: Obstetrics and Gynecology

## 2020-01-02 DIAGNOSIS — Z833 Family history of diabetes mellitus: Secondary | ICD-10-CM | POA: Insufficient documentation

## 2020-01-02 DIAGNOSIS — Z880 Allergy status to penicillin: Secondary | ICD-10-CM | POA: Insufficient documentation

## 2020-01-02 DIAGNOSIS — Z3689 Encounter for other specified antenatal screening: Secondary | ICD-10-CM

## 2020-01-02 DIAGNOSIS — Z3A37 37 weeks gestation of pregnancy: Secondary | ICD-10-CM | POA: Insufficient documentation

## 2020-01-02 DIAGNOSIS — Z8759 Personal history of other complications of pregnancy, childbirth and the puerperium: Secondary | ICD-10-CM | POA: Insufficient documentation

## 2020-01-02 DIAGNOSIS — O09293 Supervision of pregnancy with other poor reproductive or obstetric history, third trimester: Secondary | ICD-10-CM | POA: Insufficient documentation

## 2020-01-02 DIAGNOSIS — O09523 Supervision of elderly multigravida, third trimester: Secondary | ICD-10-CM | POA: Insufficient documentation

## 2020-01-02 DIAGNOSIS — O368130 Decreased fetal movements, third trimester, not applicable or unspecified: Secondary | ICD-10-CM | POA: Diagnosis not present

## 2020-01-02 NOTE — Discharge Instructions (Signed)
Fetal Movement Counts Patient Name: ________________________________________________ Patient Due Date: ____________________ What is a fetal movement count?  A fetal movement count is the number of times that you feel your baby move during a certain amount of time. This may also be called a fetal kick count. A fetal movement count is recommended for every pregnant woman. You may be asked to start counting fetal movements as early as week 28 of your pregnancy. Pay attention to when your baby is most active. You may notice your baby's sleep and wake cycles. You may also notice things that make your baby move more. You should do a fetal movement count:  When your baby is normally most active.  At the same time each day. A good time to count movements is while you are resting, after having something to eat and drink. How do I count fetal movements? 1. Find a quiet, comfortable area. Sit, or lie down on your side. 2. Write down the date, the start time and stop time, and the number of movements that you felt between those two times. Take this information with you to your health care visits. 3. Write down your start time when you feel the first movement. 4. Count kicks, flutters, swishes, rolls, and jabs. You should feel at least 10 movements. 5. You may stop counting after you have felt 10 movements, or if you have been counting for 2 hours. Write down the stop time. 6. If you do not feel 10 movements in 2 hours, contact your health care provider for further instructions. Your health care provider may want to do additional tests to assess your baby's well-being. Contact a health care provider if:  You feel fewer than 10 movements in 2 hours.  Your baby is not moving like he or she usually does. Date: ____________ Start time: ____________ Stop time: ____________ Movements: ____________ Date: ____________ Start time: ____________ Stop time: ____________ Movements: ____________ Date: ____________  Start time: ____________ Stop time: ____________ Movements: ____________ Date: ____________ Start time: ____________ Stop time: ____________ Movements: ____________ Date: ____________ Start time: ____________ Stop time: ____________ Movements: ____________ Date: ____________ Start time: ____________ Stop time: ____________ Movements: ____________ Date: ____________ Start time: ____________ Stop time: ____________ Movements: ____________ Date: ____________ Start time: ____________ Stop time: ____________ Movements: ____________ Date: ____________ Start time: ____________ Stop time: ____________ Movements: ____________ This information is not intended to replace advice given to you by your health care provider. Make sure you discuss any questions you have with your health care provider. Document Revised: 10/24/2018 Document Reviewed: 10/24/2018 Elsevier Patient Education  2020 [employer]  

## 2020-01-02 NOTE — MAU Provider Note (Signed)
History     CSN: [id number]  Arrival date and time: 01/02/20 1131   First Provider Initiated Contact with Patient 01/02/20 1211      Chief Complaint  Patient presents with  . fetal monitoring   Aracelie R Slaby is a 34 y.o. [id number] at ,[redacted]w[redacted]d who presents today from the office for fetal movement. She was in the office today, and had a BPP 8/8, but when they did the NST patient was having variables. So she was sent here for prolonged monitoring. She reports that fetal movement is decreased. She denies any VB or LOF. She has been having contractions for about 2 weeks, but no significant change in frequency or intensity. Last time she ate/drank: 0845 today she had an apple, cup of coffee and a bottle of water. Next OB appt is scheduled for 01/05/2020. Patient had BMZ on 10/11 and 12/30/2019. She denies any complications with the pregnancy at this time.    OB History    Gravida  4   Para  1   Term  1   Preterm      AB  2   Living  1     SAB  1   TAB      Ectopic  1   Multiple      Live Births  1           Past Medical History:  Diagnosis Date  . Ectopic pregnancy without intrauterine pregnancy 11/15/2017  . Medical history non-contributory     Past Surgical History:  Procedure Laterality Date  . LAPAROSCOPIC UNILATERAL SALPINGECTOMY Right 11/15/2017   Procedure: LAPAROSCOPIC UNILATERAL SALPINGECTOMY;  Surgeon: Shea Evans, MD;  Location: [facility];  Service: Gynecology;  Laterality: Right;  . LAPAROSCOPY N/A 11/15/2017   Procedure: LAPAROSCOPY OPERATIVE;  Surgeon: Shea Evans, MD;  Location: [facility];  Service: Gynecology;  Laterality: N/A;  . NO PAST SURGERIES      Family History  Problem Relation Age of Onset  . Diabetes Maternal Grandmother     Social History   Tobacco Use  . Smoking status: Never Smoker  . Smokeless tobacco: Never Used  Vaping Use  . Vaping Use: Never used  Substance Use Topics  . Alcohol use: No  . Drug use: No    Allergies:   Allergies  Allergen Reactions  . Penicillins Other (See Comments)    Abdominal pain     Medications Prior to Admission  Medication Sig Dispense Refill Last Dose  . Prenatal Vit-Fe Fumarate-FA (MULTIVITAMIN-PRENATAL) 27-0.8 MG TABS tablet Take 1 tablet by mouth daily at 12 noon.   01/01/2020 at Unknown time    Review of Systems Physical Exam   Blood pressure (!) 112/56, pulse 92, temperature 98.7 F (37.1 C), temperature source Oral, resp. rate 18, SpO2 95 %, unknown if currently breastfeeding.  Physical Exam Vitals and nursing note reviewed.  Constitutional:      General: She is not in acute distress. HENT:     Head: Normocephalic.  Cardiovascular:     Rate and Rhythm: Normal rate.  Pulmonary:     Effort: Pulmonary effort is normal.  Abdominal:     Palpations: Abdomen is soft.     Tenderness: There is no abdominal tenderness.  Skin:    General: Skin is warm and dry.  Neurological:     Mental Status: She is alert and oriented to person, place, and time.  Psychiatric:        Mood and Affect: Mood normal.  Behavior: Behavior normal.       NST:  Baseline: 140 Variability: moderate Accels: 15x15 Decels: one variable at 1207 but no further variables noted after than patient patient on the monitor until 1415 Toco: irregular  Reactive/Appropriate for GA  Patient has been feeling normal, and frequent movement since being on the monitor.   MAU Course  Procedures  MDM 2:02 PM DW Dr. Alysia Penna, reviewed patient history and current NST. Patient is ok for DC home. Plan to FU with [facility] as planned for Monday 01/05/2020.   Assessment and Plan   1. NST (non-stress test) reactive   2. [redacted] weeks gestation of pregnancy    DC home 3rd Trimester precautions  labor precautions  Fetal kick counts RX: none  Return to MAU as needed FU with OB as planned   Follow-up Information    [facility].   Contact information: [address] [place] [address] [phone]                 Thressa Sheller DNP, CNM  01/02/20  2:03 PM

## 2020-01-02 NOTE — MAU Note (Signed)
.   Kara Roach is a 34 y.o. at [redacted]w[redacted]d here in MAU reporting: she was sent over from office for fetal monitoring, NR NST in office today per pt. Reports irregular contractions, denies any VB or LOF  DFM   Onset of complaint: ongoing Pain score: 4 Vitals:   01/02/20 1145 01/02/20 1146  BP:  (!) 113/48  Pulse:  87  Resp:  16  Temp:  98.7 F (37.1 C)  SpO2: 98%      FHT:150 Lab orders placed from triage:

## 2020-01-05 ENCOUNTER — Other Ambulatory Visit: Payer: Self-pay

## 2020-01-05 ENCOUNTER — Inpatient Hospital Stay (HOSPITAL_COMMUNITY): Payer: BC Managed Care – PPO | Admitting: Anesthesiology

## 2020-01-05 ENCOUNTER — Inpatient Hospital Stay (HOSPITAL_COMMUNITY)
Admission: AD | Admit: 2020-01-05 | Discharge: 2020-01-08 | DRG: 807 | Disposition: A | Discharge status: Home / Self Care | Payer: BC Managed Care – PPO | Attending: Obstetrics and Gynecology | Admitting: Obstetrics and Gynecology

## 2020-01-05 ENCOUNTER — Encounter (HOSPITAL_COMMUNITY): Payer: Self-pay | Admitting: Obstetrics and Gynecology

## 2020-01-05 DIAGNOSIS — Z3490 Encounter for supervision of normal pregnancy, unspecified, unspecified trimester: Secondary | ICD-10-CM | POA: Diagnosis present

## 2020-01-05 DIAGNOSIS — O368190 Decreased fetal movements, unspecified trimester, not applicable or unspecified: Secondary | ICD-10-CM | POA: Diagnosis present

## 2020-01-05 DIAGNOSIS — O368130 Decreased fetal movements, third trimester, not applicable or unspecified: Principal | ICD-10-CM | POA: Diagnosis present

## 2020-01-05 DIAGNOSIS — O288 Other abnormal findings on antenatal screening of mother: Secondary | ICD-10-CM | POA: Diagnosis present

## 2020-01-05 DIAGNOSIS — Z20822 Contact with and (suspected) exposure to covid-19: Secondary | ICD-10-CM | POA: Diagnosis present

## 2020-01-05 DIAGNOSIS — Z3A37 37 weeks gestation of pregnancy: Secondary | ICD-10-CM | POA: Diagnosis not present

## 2020-01-05 DIAGNOSIS — O09529 Supervision of elderly multigravida, unspecified trimester: Secondary | ICD-10-CM

## 2020-01-05 LAB — TYPE AND SCREEN
ABO/RH(D): O POS
Antibody Screen: NEGATIVE

## 2020-01-05 LAB — CBC
HCT: 35.8 % — ABNORMAL LOW (ref 36.0–46.0)
Hemoglobin: 11.1 g/dL — ABNORMAL LOW (ref 12.0–15.0)
MCH: 23.9 pg — ABNORMAL LOW (ref 26.0–34.0)
MCHC: 31 g/dL (ref 30.0–36.0)
MCV: 77.2 fL — ABNORMAL LOW (ref 80.0–100.0)
Platelets: 260 10*3/uL (ref 150–400)
RBC: 4.64 MIL/uL (ref 3.87–5.11)
RDW: 14.9 % (ref 11.5–15.5)
WBC: 9 10*3/uL (ref 4.0–10.5)
nRBC: 0 % (ref 0.0–0.2)

## 2020-01-05 LAB — RESPIRATORY PANEL BY RT PCR (FLU A&B, COVID)
Influenza A by PCR: NEGATIVE
Influenza B by PCR: NEGATIVE
SARS Coronavirus 2 by RT PCR: NEGATIVE

## 2020-01-05 MED ORDER — SODIUM CHLORIDE (PF) 0.9 % IJ SOLN
INTRAMUSCULAR | Status: DC | PRN
  Administered 2020-01-05: 12 mL/h via EPIDURAL

## 2020-01-05 MED ORDER — OXYCODONE-ACETAMINOPHEN 5-325 MG PO TABS: 2.0000 | ORAL_TABLET | ORAL | Status: DC | PRN

## 2020-01-05 MED ORDER — LACTATED RINGERS IV SOLN: INTRAVENOUS | Status: DC

## 2020-01-05 MED ORDER — OXYCODONE-ACETAMINOPHEN 5-325 MG PO TABS: 1.0000 | ORAL_TABLET | ORAL | Status: DC | PRN

## 2020-01-05 MED ORDER — LACTATED RINGERS IV SOLN: 500.0000 mL | Freq: Once | INTRAVENOUS | Status: DC

## 2020-01-05 MED ORDER — SOD CITRATE-CITRIC ACID 500-334 MG/5ML PO SOLN: 30.0000 mL | ORAL | Status: DC | PRN

## 2020-01-05 MED ORDER — LIDOCAINE HCL (PF) 1 % IJ SOLN: 30.0000 mL | INTRAMUSCULAR | Status: DC | PRN

## 2020-01-05 MED ORDER — EPHEDRINE 5 MG/ML INJ: 10.0000 mg | INTRAVENOUS | Status: DC | PRN

## 2020-01-05 MED ORDER — TERBUTALINE SULFATE 1 MG/ML IJ SOLN: 0.2500 mg | Freq: Once | INTRAMUSCULAR | Status: DC | PRN

## 2020-01-05 MED ORDER — LACTATED RINGERS IV SOLN: 500.0000 mL | INTRAVENOUS | Status: DC | PRN

## 2020-01-05 MED ORDER — ONDANSETRON HCL 4 MG/2ML IJ SOLN: 4.0000 mg | Freq: Four times a day (QID) | INTRAMUSCULAR | Status: DC | PRN

## 2020-01-05 MED ORDER — OXYTOCIN-SODIUM CHLORIDE 30-0.9 UT/500ML-% IV SOLN
1.0000 m[IU]/min | INTRAVENOUS | Status: DC
  Administered 2020-01-05: 2 m[IU]/min via INTRAVENOUS

## 2020-01-05 MED ORDER — FENTANYL-BUPIVACAINE-NACL 0.5-0.125-0.9 MG/250ML-% EP SOLN
12.0000 mL/h | EPIDURAL | Status: DC | PRN
  Filled 2020-01-05: qty 250

## 2020-01-05 MED ORDER — OXYTOCIN-SODIUM CHLORIDE 30-0.9 UT/500ML-% IV SOLN
2.5000 [IU]/h | INTRAVENOUS | Status: DC
  Administered 2020-01-06: 2.5 [IU]/h via INTRAVENOUS
  Filled 2020-01-05: qty 500

## 2020-01-05 MED ORDER — LIDOCAINE HCL (PF) 1 % IJ SOLN
INTRAMUSCULAR | Status: DC | PRN
  Administered 2020-01-05: 2 mL via EPIDURAL
  Administered 2020-01-05: 10 mL via EPIDURAL

## 2020-01-05 MED ORDER — PHENYLEPHRINE 40 MCG/ML (10ML) SYRINGE FOR IV PUSH (FOR BLOOD PRESSURE SUPPORT): 80.0000 ug | PREFILLED_SYRINGE | INTRAVENOUS | Status: DC | PRN

## 2020-01-05 MED ORDER — OXYTOCIN BOLUS FROM INFUSION
333.0000 mL | Freq: Once | INTRAVENOUS | Status: AC
  Administered 2020-01-06: 333 mL via INTRAVENOUS

## 2020-01-05 MED ORDER — DIPHENHYDRAMINE HCL 50 MG/ML IJ SOLN: 12.5000 mg | INTRAMUSCULAR | Status: DC | PRN

## 2020-01-05 MED ORDER — ACETAMINOPHEN 325 MG PO TABS: 650.0000 mg | ORAL_TABLET | ORAL | Status: DC | PRN

## 2020-01-05 NOTE — Progress Notes (Signed)
Labor Progress Note  S/O: Patient reporting increased pain with contractions. Mostly in lower abdomen and back. No complaints of rectal presure or urge to push  Vitals:   01/05/20 2036 01/05/20 2101  BP: (!) 118/45 (!) 108/56  Pulse: 82 84  Resp:  18  Temp: 98.2 F (36.8 C)     SVE: 9/90/-1  FHT: cat II baseline 140 bpm mod var intermittent variable and early decelerations Toco:ctxs q 3-4 min  A/P: 34Y G4P1021 @ 37.4 IOL dec FM and non-reactive NST s/p BMZ x2  -IOL: currently on Pitocin, cont to titrate per protocol, s/p AROM for clear fluid, making good progress -cont EFM/Toco -cat II tracing: fetal resuscitative measures with maternal repositioning, reassuring with moderate variability, cont to reassess as needed -epidural labor pain mgmt -GBS NEG -routine intrapartum care -anticipate vaginal delivery  Kara Roach A Kara Roach 01/05/20 9:39 PM

## 2020-01-05 NOTE — Progress Notes (Signed)
Labor Progress  Note  S/O: Patient comfortable with epidural, no complaints of rectal pressure  Vitals:   01/05/20 2000 01/05/20 2036  BP: 122/62 (!) 118/45  Pulse: 87 82  Resp: 16   Temp:  98.2 F (36.8 C)    SVE: 7/70/-2 AROM clear fluid  FHT: cat I baseline 150 bpm mod var -accels, -decels Toco: ctxs q 2-4 min  A/P: 34Y G4P1021 @ 37.4 IOL dec FM and non-reactive NST s/p BMZx2  -IOL: cont Pitocin 2x2 per protocol, s/p AROM for clear fluid -cont EFM/Toco -cat I tracing currently, cont close monitoring and reassess as indicated -epidural labor pain mgmt -GBS NEG -routine intrapartum care -anticipate vaginal delivery  Kara Roach A Harrison Zetina 01/05/20 8:47 PM

## 2020-01-05 NOTE — Anesthesia Procedure Notes (Signed)
Epidural Patient location during procedure: OB Start time: 01/05/2020 5:44 PM End time: 01/05/2020 5:52 PM  Staffing Anesthesiologist: Lannie Fields, DO Performed: anesthesiologist   Preanesthetic Checklist Completed: patient identified, IV checked, risks and benefits discussed, monitors and equipment checked, pre-op evaluation and timeout performed  Epidural Patient position: sitting Prep: DuraPrep and site prepped and draped Patient monitoring: continuous pulse ox, blood pressure, heart rate and cardiac monitor Approach: midline Location: L3-L4 Injection technique: LOR air  Needle:  Needle type: Tuohy  Needle gauge: 17 G Needle length: 9 cm Needle insertion depth: 5.5 cm Catheter type: closed end flexible Catheter size: 19 Gauge Catheter at skin depth: 11 cm Test dose: negative  Assessment Sensory level: T8 Events: blood not aspirated, injection not painful, no injection resistance, no paresthesia and negative IV test  Additional Notes Patient identified. Risks/Benefits/Options discussed with patient including but not limited to bleeding, infection, nerve damage, paralysis, failed block, incomplete pain control, headache, blood pressure changes, nausea, vomiting, reactions to medication both or allergic, itching and postpartum back pain. Confirmed with bedside nurse the patient's most recent platelet count. Confirmed with patient that they are not currently taking any anticoagulation, have any bleeding history or any family history of bleeding disorders. Patient expressed understanding and wished to proceed. All questions were answered. Sterile technique was used throughout the entire procedure. Please see nursing notes for vital signs. Test dose was given through epidural catheter and negative prior to continuing to dose epidural or start infusion. Warning signs of high block given to the patient including shortness of breath, tingling/numbness in hands, complete motor  block, or any concerning symptoms with instructions to call for help. Patient was given instructions on fall risk and not to get out of bed. All questions and concerns addressed with instructions to call with any issues or inadequate analgesia.  Reason for block:procedure for pain

## 2020-01-05 NOTE — H&P (Signed)
Kara Roach is a 34 y.o. female [phone] [redacted]w[redacted]d presenting for IOL. Patient presented to the office today for routine [facility] visit and noted decreased FM. Reported only 3 appreciable movements in the past 24 hours. NST was done that showed normal baseline with moderate variability but no accelerations and 1 variable deceleration. Patient has been complaining of decreased FM for the past 1-2 weeks. She had been admitted to antepartum service on 10/11-10/12 @ 36.3-36.4 for these complaints with non-reactive NST in office as well. She did receive BMZ during that admission. BPP's have remained 8/8 with normal AFI of 16cm on last scan from 10/15.  Today she also reports increased intensity of contractions, which have been off and on over the past few weeks. Denies any VB or LOF.  Routine PNC at [facility]. Otherwise uncomplicated pregnancy. Was measuring S>D and had growth [place] @ 32w with an EFW at the 99% (5#13) and AC 99% vertex and posterior placenta. Negative diabetes screen. Low risk NIPS and normal anatomy US.   History of right salpingectomy for ectopic pregnancy. No other surgeries. No additional daily medications besides PNVs  OB History    Gravida  4   Para  1   Term  1   Preterm      AB  2   Living  1     SAB  1   TAB      Ectopic  1   Multiple      Live Births  1          Past Medical History:  Diagnosis Date  . Ectopic pregnancy without intrauterine pregnancy 11/15/2017  . Medical history non-contributory    Past Surgical History:  Procedure Laterality Date  . LAPAROSCOPIC UNILATERAL SALPINGECTOMY Right 11/15/2017   Procedure: LAPAROSCOPIC UNILATERAL SALPINGECTOMY;  Surgeon: Kara Evans, MD;  Location: [facility];  Service: Gynecology;  Laterality: Right;  . LAPAROSCOPY N/A 11/15/2017   Procedure: LAPAROSCOPY OPERATIVE;  Surgeon: Kara Evans, MD;  Location: [facility];  Service: Gynecology;  Laterality: N/A;  . NO PAST SURGERIES     Family History: family history includes  Diabetes in her maternal grandmother. Social History:  reports that she has never smoked. She has never used smokeless tobacco. She reports that she does not drink alcohol and does not use drugs.     Maternal Diabetes: No 1hr GTT 134 Genetic Screening: Normal Maternal Ultrasounds/Referrals: Normal Fetal Ultrasounds or other Referrals:  None Maternal Substance Abuse: No Significant Maternal Medications:  None Significant Maternal Lab Results:  Group B Strep negative Other Comments:  None  Review of Systems  All other systems reviewed and are negative.  Per HPI Maternal Exam:  Uterine Assessment: Contraction frequency is irregular.   Abdomen: Patient reports no abdominal tenderness. Surgical scars: laparoscopic scar.   Fetal presentation: vertex  Introitus: not evaluated.   Cervix: not evaluated.   Fetal Exam Fetal Monitor Review: Baseline rate: 140.  Variability: moderate (6-25 bpm).   Pattern: no accelerations and variable decelerations.    Fetal State Assessment: Category II - tracings are indeterminate.     Physical Exam Vitals reviewed.  Constitutional:      Appearance: Normal appearance.  HENT:     Head: Normocephalic.  Cardiovascular:     Rate and Rhythm: Normal rate.  Pulmonary:     Effort: Pulmonary effort is normal.  Musculoskeletal:        General: Normal range of motion.     Cervical back: Normal range of motion.  Skin:  General: Skin is warm and dry.  Neurological:     General: No focal deficit present.     Mental Status: She is alert and oriented to person, place, and time.  Psychiatric:        Mood and Affect: Mood normal.        Behavior: Behavior normal.     Dilation: 5 Effacement (%): 70 Station: -3 Exam by:: Kara Roach, RNC Blood pressure 116/62, pulse 89, temperature 98.6 F (37 C), temperature source Oral, resp. rate 16, height 5\' 2"  (1.575 m), weight 71.9 kg, unknown if currently breastfeeding.  Prenatal labs: ABO, Rh:   --/--/O POS (10/18 1650) Antibody: NEG (10/18 1650) Rubella: Immune (04/15 0000) RPR: NON REACTIVE (10/11 1030)  HBsAg: Negative (04/15 0000)  HIV: Non-reactive (04/15 0000)  GBS: Negative/-- (10/11 0000)   Assessment/Plan: 34Y 11-08-1988 @ 37.4 IOL for persistent non-reactive NST in the setting of decreased FM  Patient has been complaining of decreased FM for over 1 week with reassuring BPP's but persistently non-reactive NST's with occasional variable decelerations. She is now BMZ complete (10/11-10/12) and full term. We discussed low but possible risk of neonatal respiratory distress and potential NICU admission with early term delivery. We also discussed low but possible risk of IUFD with continued outpatient management with antenatal fetal surveillance. Patient understands risks and benefits and would like to proceed with IOL. This plan of care was also reviewed with MFM, who is in agreement with plan and recommendation for proceeding with delivery.  -Admission to LD for IOL mgmt -Routine admission labs -Continuous EFM/Toco -Cat II with occasional variable decels but reassuring with moderate variability, will continue to monitor closely and reassess as needed -IOL with Pitocin 2x2 titrate per protocol, plan on AROM when able -Epidural labor pain mgmt -GBS NEG -RH POS -Rubella Immune -Clear liquid diet -Routine intrapartum care -Anticipate vaginal delivery  Malayah Roach A Kara Roach 01/05/2020, 6:19 PM

## 2020-01-05 NOTE — Anesthesia Preprocedure Evaluation (Signed)
Anesthesia Evaluation  Patient identified by MRN, date of birth, ID band Patient awake    Reviewed: Allergy & Precautions, Patient's Chart, lab work & pertinent test results  Airway Mallampati: II  TM Distance: >3 FB Neck ROM: Full    Dental no notable dental hx.    Pulmonary neg pulmonary ROS,    Pulmonary exam normal breath sounds clear to auscultation       Cardiovascular negative cardio ROS Normal cardiovascular exam Rhythm:Regular Rate:Normal     Neuro/Psych PSYCHIATRIC DISORDERS Anxiety negative neurological ROS     GI/Hepatic negative GI ROS, Neg liver ROS,   Endo/Other  negative endocrine ROS  Renal/GU negative Renal ROS  negative genitourinary   Musculoskeletal negative musculoskeletal ROS (+)   Abdominal   Peds negative pediatric ROS (+)  Hematology  (+) Blood dyscrasia, anemia , hct 35.8, plt 260   Anesthesia Other Findings   Reproductive/Obstetrics (+) Pregnancy                             Anesthesia Physical Anesthesia Plan  ASA: II and emergent  Anesthesia Plan: Epidural   Post-op Pain Management:    Induction:   PONV Risk Score and Plan: 2  Airway Management Planned: Natural Airway  Additional Equipment: None  Intra-op Plan:   Post-operative Plan:   Informed Consent: I have reviewed the patients History and Physical, chart, labs and discussed the procedure including the risks, benefits and alternatives for the proposed anesthesia with the patient or authorized representative who has indicated his/her understanding and acceptance.       Plan Discussed with:   Anesthesia Plan Comments:         Anesthesia Quick Evaluation

## 2020-01-06 ENCOUNTER — Encounter (HOSPITAL_COMMUNITY): Payer: Self-pay | Admitting: Obstetrics and Gynecology

## 2020-01-06 LAB — RPR: RPR Ser Ql: NONREACTIVE

## 2020-01-06 LAB — CBC
HCT: 34.3 % — ABNORMAL LOW (ref 36.0–46.0)
Hemoglobin: 10.9 g/dL — ABNORMAL LOW (ref 12.0–15.0)
MCH: 24.4 pg — ABNORMAL LOW (ref 26.0–34.0)
MCHC: 31.8 g/dL (ref 30.0–36.0)
MCV: 76.7 fL — ABNORMAL LOW (ref 80.0–100.0)
Platelets: 218 10*3/uL (ref 150–400)
RBC: 4.47 MIL/uL (ref 3.87–5.11)
RDW: 15.1 % (ref 11.5–15.5)
WBC: 16.3 10*3/uL — ABNORMAL HIGH (ref 4.0–10.5)
nRBC: 0 % (ref 0.0–0.2)

## 2020-01-06 MED ORDER — PRENATAL MULTIVITAMIN CH
1.0000 | ORAL_TABLET | Freq: Every day | ORAL | Status: DC
  Administered 2020-01-06 – 2020-01-08 (×3): 1 via ORAL
  Filled 2020-01-06 (×3): qty 1

## 2020-01-06 MED ORDER — ONDANSETRON HCL 4 MG PO TABS: 4.0000 mg | ORAL_TABLET | ORAL | Status: DC | PRN

## 2020-01-06 MED ORDER — ACETAMINOPHEN 325 MG PO TABS
650.0000 mg | ORAL_TABLET | ORAL | Status: DC | PRN
  Administered 2020-01-06: 650 mg via ORAL
  Filled 2020-01-06 (×2): qty 2

## 2020-01-06 MED ORDER — SIMETHICONE 80 MG PO CHEW: 80.0000 mg | CHEWABLE_TABLET | ORAL | Status: DC | PRN

## 2020-01-06 MED ORDER — ZOLPIDEM TARTRATE 5 MG PO TABS: 5.0000 mg | ORAL_TABLET | Freq: Every evening | ORAL | Status: DC | PRN

## 2020-01-06 MED ORDER — DIPHENHYDRAMINE HCL 25 MG PO CAPS: 25.0000 mg | ORAL_CAPSULE | Freq: Four times a day (QID) | ORAL | Status: DC | PRN

## 2020-01-06 MED ORDER — IBUPROFEN 600 MG PO TABS
600.0000 mg | ORAL_TABLET | Freq: Four times a day (QID) | ORAL | Status: DC
  Administered 2020-01-06 – 2020-01-08 (×7): 600 mg via ORAL
  Filled 2020-01-06 (×9): qty 1

## 2020-01-06 MED ORDER — BENZOCAINE-MENTHOL 20-0.5 % EX AERO
1.0000 "application " | INHALATION_SPRAY | CUTANEOUS | Status: DC | PRN
  Administered 2020-01-06: 1 via TOPICAL
  Filled 2020-01-06: qty 56

## 2020-01-06 MED ORDER — TETANUS-DIPHTH-ACELL PERTUSSIS 5-2.5-18.5 LF-MCG/0.5 IM SUSP: 0.5000 mL | Freq: Once | INTRAMUSCULAR | Status: DC

## 2020-01-06 MED ORDER — DIBUCAINE (PERIANAL) 1 % EX OINT: 1.0000 "application " | TOPICAL_OINTMENT | CUTANEOUS | Status: DC | PRN

## 2020-01-06 MED ORDER — WITCH HAZEL-GLYCERIN EX PADS: 1.0000 "application " | MEDICATED_PAD | CUTANEOUS | Status: DC | PRN

## 2020-01-06 MED ORDER — ONDANSETRON HCL 4 MG/2ML IJ SOLN: 4.0000 mg | INTRAMUSCULAR | Status: DC | PRN

## 2020-01-06 MED ORDER — OXYCODONE HCL 5 MG PO TABS: 10.0000 mg | ORAL_TABLET | ORAL | Status: DC | PRN

## 2020-01-06 MED ORDER — SENNOSIDES-DOCUSATE SODIUM 8.6-50 MG PO TABS
2.0000 | ORAL_TABLET | ORAL | Status: DC
  Administered 2020-01-07: 2 via ORAL
  Filled 2020-01-06: qty 2

## 2020-01-06 MED ORDER — OXYCODONE HCL 5 MG PO TABS
5.0000 mg | ORAL_TABLET | ORAL | Status: DC | PRN
  Administered 2020-01-06: 5 mg via ORAL
  Filled 2020-01-06: qty 1

## 2020-01-06 MED ORDER — COCONUT OIL OIL: 1.0000 | TOPICAL_OIL | Status: DC | PRN

## 2020-01-06 NOTE — Anesthesia Postprocedure Evaluation (Signed)
Anesthesia Post Note  Patient: Laylia R Nappier  Procedure(s) Performed: AN AD HOC LABOR EPIDURAL     Patient location during evaluation: Mother Baby Anesthesia Type: Epidural Level of consciousness: awake Pain management: pain level controlled Vital Signs Assessment: post-procedure vital signs reviewed and stable Respiratory status: spontaneous breathing and respiratory function stable Cardiovascular status: blood pressure returned to baseline Postop Assessment: no headache, epidural receding, patient able to bend at knees, adequate PO intake, no backache, no apparent nausea or vomiting and able to ambulate Anesthetic complications: no   No complications documented.  Last Vitals:  Vitals:   01/06/20 0230 01/06/20 0448  BP: 123/66 120/65  Pulse: 80 94  Resp: 18 16  Temp:  37.2 C  SpO2:      Last Pain:  Vitals:   01/06/20 0448  TempSrc: Oral  PainSc:    Pain Goal:                   Cleda Clarks

## 2020-01-06 NOTE — Lactation Note (Addendum)
This note was copied from a baby's chart. Lactation Consultation Note  Patient Name: Kara Roach [id number]'P Date: 01/06/2020 Reason for consult: Initial assessment;Early term 37-38.6wks  P2 mom.  Infant had low glucose and approx. 3 hours ago received formula.  Infant cueing with RN assessment.  LC offered to assist with latching.  Mom held infant in cradle hold.  LC offered suggestion of supporting neck/head more with cross cradle then moving to cradle once latch was sustained.  Mom was not comfortable when trying this.    She can hand express.  Infant does flange lips and latched shallowly to breast.  LC offered to assist with bringing infant to the breast so mom could feel a deeper latch. Infant latched with wide gape and LC encouraged mom to use compression and massage to encourage swallows.  A couple were heard.  Infant had deep rhythmic jaw movements and latch was comfortable.  Mom pulled back breast tissue away from face and latch became less deep with lips visible.  Benefits of a deep latch were reviewed with mom: decreases sore nipples, better milk transfer, and stimulates mom's milk supply.  Infant fed actively for 7 minutes then fell asleep.  Family in room listened while BF basics reviewed.  Feeding with cues 8-12+X in 24 hours, STS, and offering both breasts during the feeding.  Breaking the latch was also reviewed.   MOm has a medela pump at home.   Lactation brochure provided.  OP LC services, phone line, and support group offerings reviewed.    All questions answered.  Interpreter was used on IPAD in case mom needed interpreting services.  LC offered interpreting and mom accepted but at the beginning of the consult stated she understood most everything.  Dad stated she would only need it for concepts she didn't know or understand; so LC asked that interpreter wait through consult to make sure mom understood all concepts.     Maternal Data Has patient been taught Hand  Expression?: Yes Does the patient have breastfeeding experience prior to this delivery?: Yes  Feeding Feeding Type: Breast Fed Nipple Type: Slow - flow  LATCH Score Latch: Repeated attempts needed to sustain latch, nipple held in mouth throughout feeding, stimulation needed to elicit sucking reflex.  Audible Swallowing: A few with stimulation  Type of Nipple: Everted at rest and after stimulation  Comfort (Breast/Nipple): Soft / non-tender  Hold (Positioning): Assistance needed to correctly position infant at breast and maintain latch.  LATCH Score: 7  Interventions Interventions: Breast feeding basics reviewed;Assisted with latch;Skin to skin;Breast massage;Hand express;Position options;Support pillows;Adjust position;Breast compression  Lactation Tools Discussed/Used WIC Program: No   Consult Status Consult Status: Follow-up Date: 01/07/20 Follow-up type: In-patient    Maryruth Hancock [facility] 01/06/2020, 12:41 PM

## 2020-01-06 NOTE — Lactation Note (Deleted)
This note was copied from a baby's chart. Lactation Consultation Note  Patient Name: Kara Roach [id number]'L Date: 01/06/2020 Reason for consult: Initial assessment;Early term 24-38.6wks   Mom has a Medela pump at home.  Maternal Data Has patient been taught Hand Expression?: Yes Does the patient have breastfeeding experience prior to this delivery?: Yes  Feeding Feeding Type: Breast Fed Nipple Type: Slow - flow  LATCH Score Latch: Repeated attempts needed to sustain latch, nipple held in mouth throughout feeding, stimulation needed to elicit sucking reflex.  Audible Swallowing: A few with stimulation  Type of Nipple: Everted at rest and after stimulation  Comfort (Breast/Nipple): Soft / non-tender  Hold (Positioning): Assistance needed to correctly position infant at breast and maintain latch.  LATCH Score: 7  Interventions Interventions: Breast feeding basics reviewed;Assisted with latch;Skin to skin;Breast massage;Hand express;Position options;Support pillows;Adjust position;Breast compression  Lactation Tools Discussed/Used WIC Program: No   Consult Status Consult Status: Follow-up Date: 01/07/20 Follow-up type: In-patient    Maryruth Hancock [facility] 01/06/2020, 12:55 PM

## 2020-01-06 NOTE — Progress Notes (Signed)
[name] was referred for history of depression/anxiety. * Referral screened out by Clinical Social Worker because none of the following criteria appear to apply: ~ History of anxiety/depression during this pregnancy, or of post-partum depression following prior delivery. ~ Diagnosis of anxiety and/or depression within last 3 years. Per further chart review, it appear that [name] was diagnosed with anxiety/depression in 2009 with reports of being stable at this time.  OR * [name]'s symptoms currently being treated with medication and/or therapy.   Please contact the Clinical Social Worker if needs arise, by [facility] request, or if [name] scores greater than 9/yes to question 10 on [place] Postpartum Depression Screen.   Kara Roach, MSW, LCSW [facility] [place] [phone]

## 2020-01-07 NOTE — Lactation Note (Addendum)
This note was copied from a baby's chart. Lactation Consultation Note  Patient Name: Kara Roach [id number] Date: 01/07/2020  Parents just came back from the NICU. Baby Kara [name] now 20 hours old. Mom just finished pumping on the right breast on arrival.. Discussed double pumping vs. Single pumping with mom.  Mom has a DEBP for home use.  Mom reports she has not opened the box yet but feels it is a Medela DEBP as well.  Urged mom to just leave her pump at home and use it at home and use the hospital/[facility] DEBP while she is here.  Mom reports she is being d/c tomorrow but [name] will probably stay.  [name] is ETI in the  NICU.  Mom reports she took her small pump botttles to the NICU and doesnt have anything to transport her breastmilk in but the really large bottles that go with her pump.  Gave mom some breastmilk storage bottles.  Urged her to use them from now on and just pump straight into them.  Discussed pumping every 2 hours during the day and every 3-4 hours at night.  Mom reports her nipples are sore,  Nipples intact. Mom reports she had sore nipples with her first baby.  Urged her to hand express and rub expressed mothers milk and air dry and use coconut oil as well with pumping.  Offered coconut oil while here but mom declined saying she has some at home. Mom using 24 mm flanges for pumping.  Mom reports that's what she used with her last baby. Discussed flange fit.Urged mom to call lactation and have pumping observed.  Gave mom sanitizing spray for ETI in NICU.  Discussed  how to use the spray it and urged parents to take it home with them. Praised pumping.  Urged parents to call lactation as needed.   Maternal Data    Feeding Feeding Type: Formula  LATCH Score                   Interventions    Lactation Tools Discussed/Used     Consult Status      Maeson Purohit Michaelle Copas 01/07/2020, 7:39 PM

## 2020-01-07 NOTE — Progress Notes (Signed)
Patient screened out for psychosocial assessment since none of the following apply:  Psychosocial stressors documented in mother or baby's chart  Gestation less than 32 weeks  Code at delivery   Infant with anomalies Please contact the Clinical Social Worker if specific needs arise, by [name]'s request, or if [name] scores greater than 9/yes to question 10 on [place] Postpartum Depression Screen.  Jasmene Goswami, LCSW Clinical Social Worker [facility] Cell#: [phone]     

## 2020-01-07 NOTE — Progress Notes (Signed)
PPD # 1 S/P NSVD  Live born female  Birth Weight: 7 lb 12.3 oz (3524 g) APGAR: 8, 9  Newborn Delivery   Birth date/time: 01/06/2020 01:01:00 Delivery type: Vaginal, Spontaneous     Baby name: [name] (in NICU) Delivering provider: LAW, CASSANDRA A   Episiotomy:None   Lacerations:2nd degree   Circumcision: Yes, planning  Feeding: breast, pumping, baby in NICU  Pain control at delivery: Epidural   Subjective   Reports feeling well. Perineal pain is improved since yesterday. Minimal cramping. Husband at the bedside.              Tolerating po/ No nausea or vomiting             Bleeding is moderate             Pain controlled with acetaminophen and ibuprofen (OTC)             Up ad lib / ambulatory / voiding without difficulties   Objective   A & O x 3, in no apparent distress              VS:  Vitals:   01/06/20 0845 01/06/20 1315 01/06/20 1715 01/07/20 0215  BP: (!) 121/59 115/61 120/73 116/64  Pulse: 92 86 81 80  Resp: 16 18 17 17   Temp: 99.2 F (37.3 C) 98.5 F (36.9 C) 98.9 F (37.2 C) 98.2 F (36.8 C)  TempSrc: Oral Oral Oral Oral  SpO2: 97% 99% 98%   Weight:      Height:        LABS:  Recent Labs    01/05/20 1650 01/06/20 0409  WBC 9.0 16.3*  HGB 11.1* 10.9*  HCT 35.8* 34.3*  PLT 260 218     Blood type: --/--/O POS (10/18 1650)  Rubella: Immune (04/15 0000)   Vaccines:   TDaP   UTD                   Flu       At discharge   Gen: AAO x 3, NAD  Abdomen: soft, non-tender, non-distended             Fundus: firm, non-tender, U-1  Perineum: repair intact, no edema  Lochia: moderate  Extremities: no edema, no calf pain or tenderness   Assessment/Plan PPD # 1 34 y.o., 11-08-1988   Principal Problem:   Postpartum care following vaginal delivery 10/19 Active Problems:   Encounter for induction of labor   SVD (spontaneous vaginal delivery) 10/19   Doing well - stable status  Routine post partum orders  Breastfeeding support prn  Anticipate  discharge tomorrow   11/19, MSN, CNM 01/07/2020, 11:42 AM

## 2020-01-08 MED ORDER — IBUPROFEN 600 MG PO TABS: 600.0000 mg | ORAL_TABLET | Freq: Four times a day (QID) | ORAL | 0 refills | Status: AC

## 2020-01-08 MED ORDER — BENZOCAINE-MENTHOL 20-0.5 % EX AERO: 1.0000 "application " | INHALATION_SPRAY | CUTANEOUS | 1 refills | Status: AC | PRN

## 2020-01-08 MED ORDER — COCONUT OIL OIL: 1.0000 "application " | TOPICAL_OIL | 0 refills | Status: AC | PRN

## 2020-01-08 MED ORDER — ACETAMINOPHEN 325 MG PO TABS: 650.0000 mg | ORAL_TABLET | ORAL | 1 refills | Status: AC | PRN

## 2020-01-08 NOTE — Progress Notes (Signed)
CSW received consult due to score 11 on Edinburgh Depression Screen.    When CSW arrived, infant's bedside, [name] was close to infant's isolette while FOB was observing from the recliner.   CSW reviewed [name]'s Edinburgh score.  [name] reported feeling "down" and "sad" because she is discharging today and infant will remain in NICU. [name] was understanding the need for infant to remain in the NICU but recognized this was not her expectation when she delivered. CSW validated and normalized [name]'s thoughts and feelings and share other emotions that [name] may experience during the postpartum period.   CSW provided education regarding Baby Blues vs PMADs and provided [name] with resources for mental health follow up.  CSW encouraged [name] to evaluate her mental health throughout the postpartum period with the use of the New Mom Checklist developed by [facility]partum [facility] as well as the [place] Postnatal Depression Scale and notify a medical professional if symptoms arise. [name] presented wit insight and awareness and did not demonstrate any acute MH symptoms. CSW assessed for safety and [name] denied SI and HI.  [name] also denied PPD symptoms with [name]'s oldest child. [name] reported having a good support team and feeling comfortable seeking help if needed.   CSW will continue to offer resources and supports to family while infant remains in NICU.    Blaine Hamper, MSW, LCSW Clinical Social Work [phone]

## 2020-01-08 NOTE — Discharge Summary (Signed)
OB Discharge Summary  Patient Name: Kara Roach DOB: August 20, 1985 MRN: [id number]  Date of admission: 01/05/2020 Delivering provider: Clance Boll A   Admitting diagnosis: Encounter for induction of labor [Z34.90] Intrauterine pregnancy: [redacted]w[redacted]d     Secondary diagnosis: Patient Active Problem List   Diagnosis Date Noted  . SVD (spontaneous vaginal delivery) 10/19 01/07/2020  . Encounter for induction of labor 01/05/2020  . Ectopic pregnancy without intrauterine pregnancy 11/15/2017  . Postpartum care following vaginal delivery 10/19 08/12/2012  . ANXIETY, MILD 01/16/2008    Date of discharge: 01/08/2020   Discharge diagnosis: Principal Problem:   Postpartum care following vaginal delivery 10/19 Active Problems:   Encounter for induction of labor   SVD (spontaneous vaginal delivery) 10/19  Post partum procedures:None  Augmentation: AROM and Pitocin Pain control: Epidural  Laceration:2nd degree  Episiotomy:None  Complications: None  Hospital course:  Induction of Labor With Vaginal Delivery   34 y.o. yo [id number] at [redacted]w[redacted]d was admitted to the hospital 01/05/2020 for induction of labor.  Indication for induction: decreased fetal movement.  Patient had an uncomplicated labor course as follows: Membrane Rupture Time/Date: 8:27 PM ,01/05/2020   Delivery Method:Vaginal, Spontaneous  Episiotomy: None  Lacerations:  2nd degree  Details of delivery can be found in separate delivery note.  Patient had a routine postpartum course. Patient is discharged home 01/09/20.  Newborn Data: Birth date:01/06/2020  Birth time:1:01 AM  Gender:Female  Living status:Living  Apgars:8 ,9  Weight:3524 g   Physical exam  Vitals:   01/07/20 0215 01/07/20 1435 01/07/20 2059 01/08/20 0536  BP: 116/64 107/60 137/71 117/67  Pulse: 80  74 76  Resp: 17 16 16 16   Temp: 98.2 F (36.8 C) 99.6 F (37.6 C) 98.1 F (36.7 C) 98.3 F (36.8 C)  TempSrc: Oral Oral  Oral  SpO2:  100% 100% 98%  Weight:       Height:       General: alert, cooperative and no distress Lochia: appropriate Uterine Fundus: firm Perineum: repair intact, no edema DVT Evaluation: No evidence of DVT seen on physical exam. No significant calf/ankle edema. Labs: Lab Results  Component Value Date   WBC 16.3 (H) 01/06/2020   HGB 10.9 (L) 01/06/2020   HCT 34.3 (L) 01/06/2020   MCV 76.7 (L) 01/06/2020   PLT 218 01/06/2020   CMP Latest Ref Rng & Units 01/10/2012  Glucose 70 - 99 mg/dL 96  BUN 6 - 23 mg/dL 8  Creatinine 01/12/2012 - 1.44 mg/dL 3.15)  Sodium 4.00(Q - 676 mEq/L 135  Potassium 3.5 - 5.1 mEq/L 4.3  Chloride 96 - 112 mEq/L 101  CO2 19 - 32 mEq/L 22  Calcium 8.4 - 10.5 mg/dL 9.8  Total Protein 6.0 - 8.3 g/dL 6.9  Total Bilirubin 0.3 - 1.2 mg/dL 195)  Alkaline Phos 39 - 117 U/L 65  AST 0 - 37 U/L 18  ALT 0 - 35 U/L 13   Edinburgh Postnatal Depression Scale Screening Tool 01/08/2020 01/07/2020  I have been able to laugh and see the funny side of things. 1 (No Data)  I have looked forward with enjoyment to things. 0 -  I have blamed myself unnecessarily when things went wrong. 2 -  I have been anxious or worried for no good reason. 2 -  I have felt scared or panicky for no good reason. 2 -  Things have been getting on top of me. 1 -  I have been so unhappy that I have had difficulty sleeping. 1 -  I have felt sad or miserable. 1 -  I have been so unhappy that I have been crying. 1 -  The thought of harming myself has occurred to me. 0 -  Edinburgh Postnatal Depression Scale Total 11 -   Vaccines: TDaP UTD         Flu    Offered at discharge         COVID-19   Unknown  Discharge instructions:  per After Visit Summary and [facility] booklet  After Visit Meds:  Allergies as of 01/08/2020      Reactions   Penicillins Other (See Comments)   Abdominal pain      Medication List    TAKE these medications   acetaminophen 325 MG tablet Commonly known as: Tylenol Take 2 tablets (650 mg total) by  mouth every 4 (four) hours as needed (for pain scale < 4).   benzocaine-Menthol 20-0.5 % Aero Commonly known as: DERMOPLAST Apply 1 application topically as needed for irritation (perineal discomfort).   coconut oil Oil Apply 1 application topically as needed.   ibuprofen 600 MG tablet Commonly known as: ADVIL Take 1 tablet (600 mg total) by mouth every 6 (six) hours.   multivitamin-prenatal 27-0.8 MG Tabs tablet Take 1 tablet by mouth daily at 12 noon.            Discharge Care Instructions  (From admission, onward)         Start     Ordered   01/08/20 0000  Discharge wound care:       Comments: Sitz baths 2 times /day with warm water x 1 week. May add herbals: 1 ounce dried comfrey leaf* 1 ounce calendula flowers 1 ounce lavender flowers  Supplies can be found online at [employer] sources at [employer], [employer]  1/2 ounce dried uva ursi leaves 1/2 ounce witch hazel blossoms (if you can find them) 1/2 ounce dried sage leaf 1/2 cup sea salt Directions: Bring 2 quarts of water to a boil. Turn off heat, and place 1 ounce (approximately 1 large handful) of the above mixed herbs (not the salt) into the pot. Steep, covered, for 30 minutes.  Strain the liquid well with a fine mesh strainer, and discard the herb material. Add 2 quarts of liquid to the tub, along with the 1/2 cup of salt. This medicinal liquid can also be made into compresses and peri-rinses.   01/08/20 1616         Diet: routine diet  Activity: Advance as tolerated. Pelvic rest for 6 weeks.   Newborn Data: Live born female  Birth Weight: 7 lb 12.3 oz (3524 g) APGAR: 8, 9  Newborn Delivery   Birth date/time: 01/06/2020 01:01:00 Delivery type: Vaginal, Spontaneous     Named [facility]cos Baby Feeding: Breast; pumping for baby in NICU Disposition:NICU  Delivery Report:  Review the Delivery Report for details.    Follow up:  Follow-up Information    Shea Evans, MD. Schedule an  appointment as soon as possible for a visit in 6 week(s).   Specialty: Obstetrics and Gynecology Why: Please make an appointment for 6 weeks postpartum.  Contact information: [address] [place] [address] [phone]              Clancy Gourd, MSN 01/08/2020, 4:16 PM

## 2020-01-08 NOTE — Lactation Note (Signed)
This note was copied from a baby's chart. Lactation Consultation Note  Patient Name: Kara Roach [id number] Date: 01/08/2020    Lactation attempted to follow up with [name] today. She was discharged from [facility], and she was not in baby's room during NICU rounding this afternoon. Lactation to follow up this week to check on milk volume and progress with pumping.   Walker Shadow 01/08/2020, 2:45 PM

## 2020-01-08 NOTE — Progress Notes (Signed)
CSW met with [name] and FOB at infant's bedside at the request of [name]. When CSW arrived, the couple was on the couch observing the infant while he was asleep in his isolette. Everyone appeared happy and comfortable. CSW explained CSW's role and invited the couple to ask their questions. The couple shared wanting to complete an application for WIC, Food Stamps, and Medicaid.  CSW provided the couple with the application process for all requested resources and encouraged them to seek help from CSW if needed. CSW also call Financial Counselor A. Malloy to assist with the Medicaid application. The couple expressed gratitude and appeared thankful for CSW's help.  CSW assessed for psychosocial stressors and barriers to visiting with infant after [name] discharges; the family denied all stressors and barriers. CSW provided the family with CSW contact information and encouraged them to contact CSW if any additional needs or concerns arise; the couple agreed.   A referral was also made to the [employer] Program at [name]'s request.   CSW is signing off since no other needs, stressors, or barriers were identified 

## 2020-01-09 ENCOUNTER — Ambulatory Visit: Payer: Self-pay

## 2020-01-09 NOTE — Lactation Note (Signed)
This note was copied from a baby's chart. Lactation Consultation Note  Patient Name: Kara Roach OEHOZ'Y Date: 01/09/2020 Reason for consult: Follow-up assessment;NICU baby;Early term 37-38.6wks;Hyperbilirubinemia;Other (Comment) (hypoglycemia)  LC in to visit with P2 Mom of ET infant in NICU.  Baby is 15 hrs old and admitted for low blood sugars.  Baby is jaundiced currently.   Mom had baby at the breast using cradle hold, swaddled without any support.  Baby cueing and opening his mouth wide.  Offered to assist with a deeper latch to breast as baby had been feeding for a couple minutes and then coming off the breast.  Removed swaddle and Mom's sleeve to provide STS for baby.  Pillow support placed under baby.  Assisted Mom to use cross cradle to better control and sandwich breast with latch.  Mom initially resistance but rationale explained and Mom complied politely.   Breasts filling, not engorged.  Mom pumped once last night and once this am.  Hand expression for milk spray.    Baby able to latch deeply to breast and suck vigorously for 7 mins.  Baby then burped and latched in cradle hold independently for additional minutes.  Mom taught how to make sure baby is latched deeply.  Regular swallowing identified.  Mom states breasts feel softer after feeding.  LC set up DEBP in room and instructed/encouraged Mom to pump after baby breastfeeds to support a full milk supply.  If baby is ad lib feeding, and breasts are softening and baby is contented after feeding, Mom can pump only when separated from baby or baby is supplemented with bottle.  Encouraged STS with baby, offering breast with cues.  Goal is at least 8-12 breastfeeds per 24 hrs.  To call LC prn.   Feeding Feeding Type: Breast Fed  LATCH Score Latch: Grasps breast easily, tongue down, lips flanged, rhythmical sucking.  Audible Swallowing: Spontaneous and intermittent  Type of Nipple: Everted at rest and after  stimulation  Comfort (Breast/Nipple): Filling, red/small blisters or bruises, mild/mod discomfort  Hold (Positioning): Assistance needed to correctly position infant at breast and maintain latch.  LATCH Score: 8  Interventions Interventions: Breast feeding basics reviewed;Assisted with latch;Skin to skin;Breast massage;Hand express;Breast compression;Adjust position;Support pillows;Position options;DEBP  Lactation Tools Discussed/Used Tools: Pump;Bottle Breast pump type: Double-Electric Breast Pump   Consult Status Consult Status: Follow-up Date: 01/10/20 Follow-up type: In-patient    Judee Clara 01/09/2020, 1:26 PM

## 2020-06-16 IMAGING — US US OB < 14 WEEKS - US OB TV
1 series · 15 of 28 positions shown · non-contrast
Comparison: No prior scans from this gestation.

CLINICAL DATA: 33-year-old pregnant female presents with vaginal
bleeding. Quantitative beta HCG pending.

EDC by LMP: 01/22/2020, projecting to an expected gestational age of
8 weeks 3 days.
EXAM:
OBSTETRIC <14 WK US AND TRANSVAGINAL OB US
TECHNIQUE: Both transabdominal and transvaginal ultrasound examinations were
performed for complete evaluation of the gestation as well as the
maternal uterus, adnexal regions, and pelvic cul-de-sac.
Transvaginal technique was performed to assess early pregnancy.

[Series 1: us ob < 14 weeks - us ob tv · 15 of 62 slices shown]
[im 1/62]
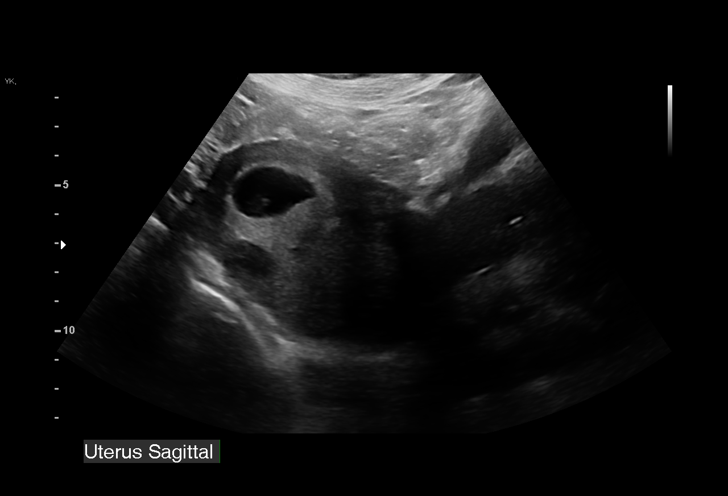
[im 5/62]
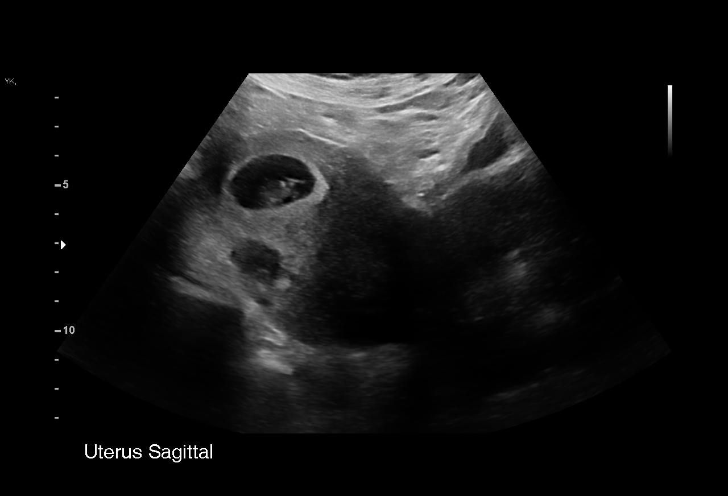
[im 10/62]
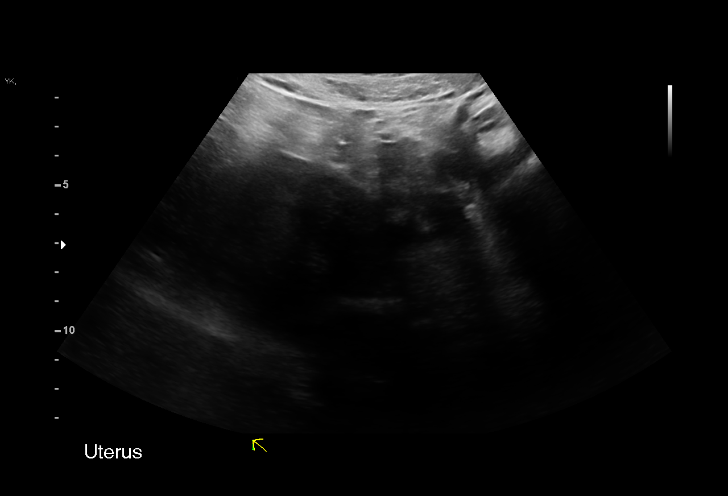
[im 14/62]
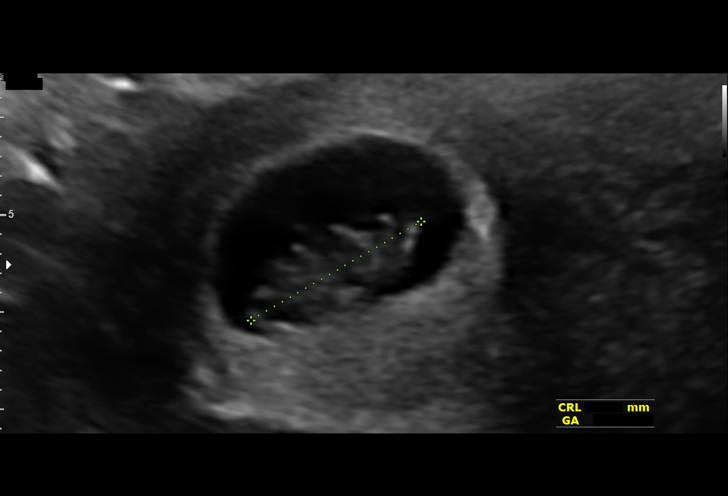
[im 19/62]
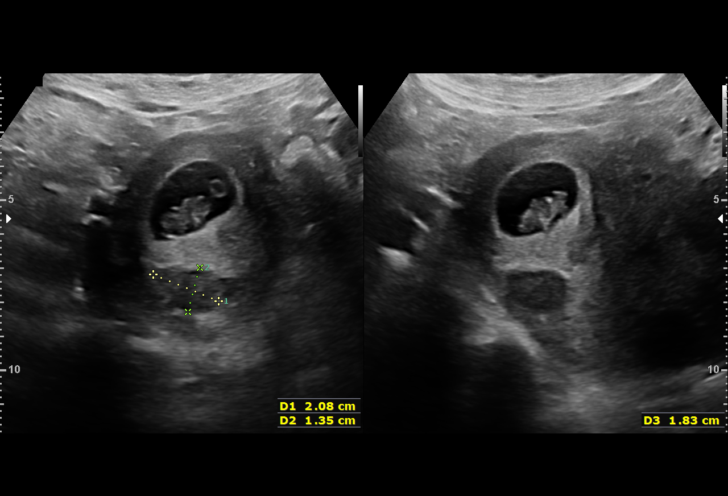
[im 23/62]
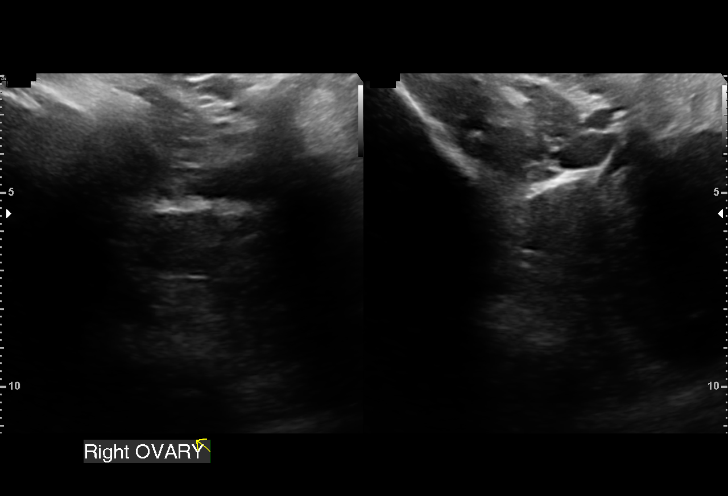
[im 28/62]
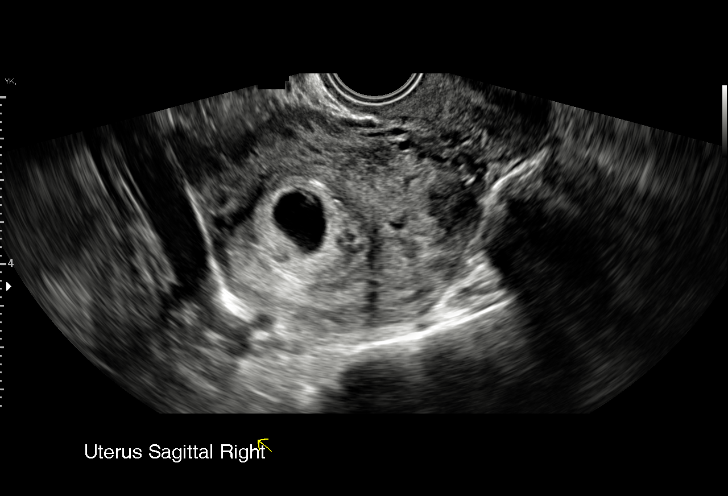
[im 32/62]
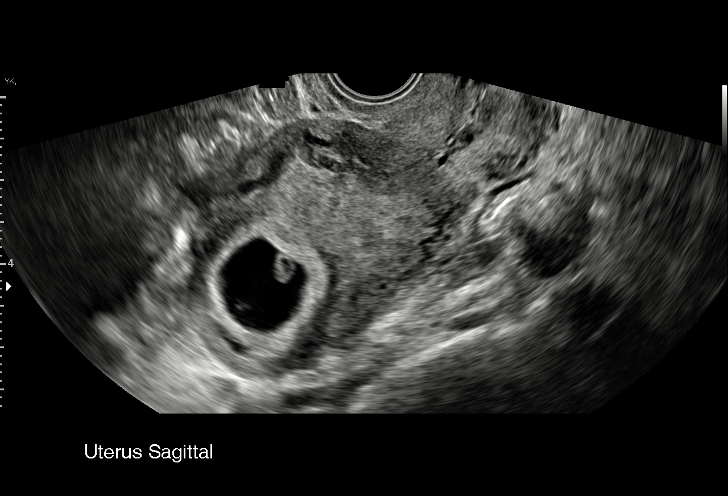
[im 34/62]
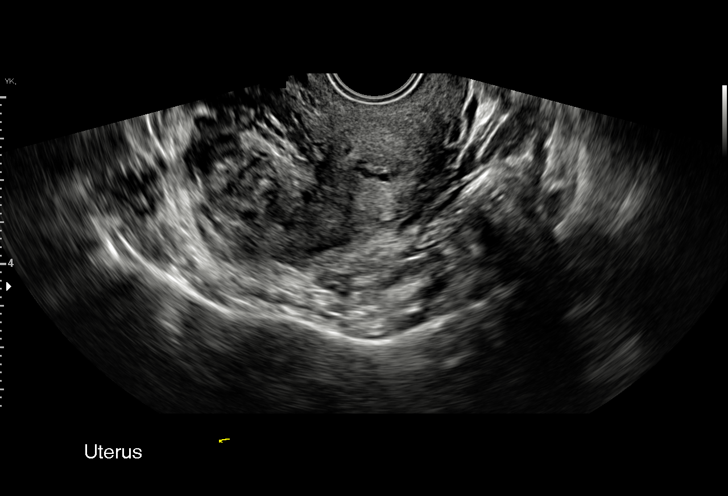
[im 39/62]
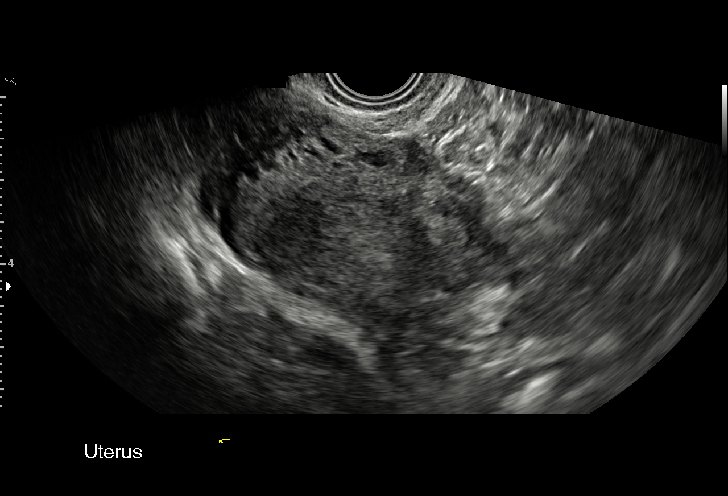
[im 43/62]
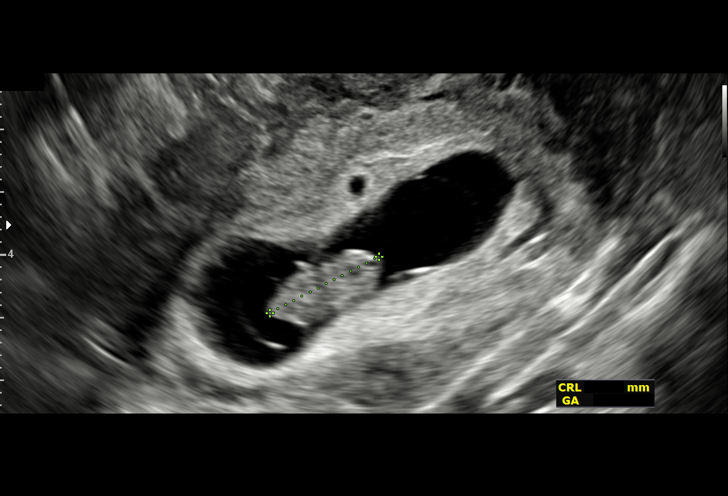
[im 48/62]
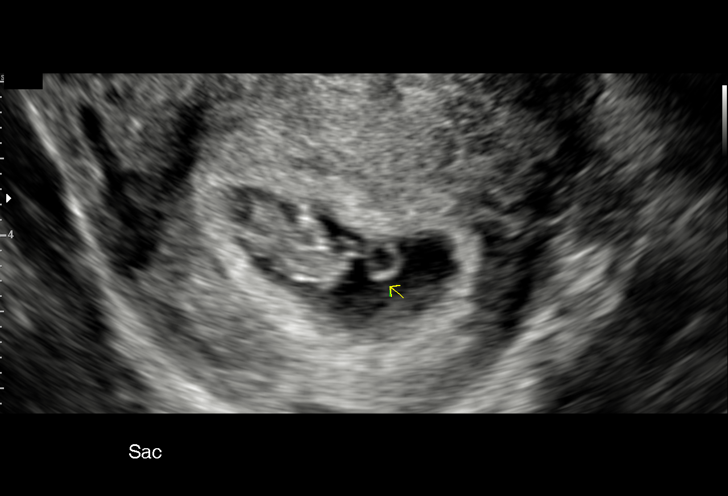
[im 52/62]
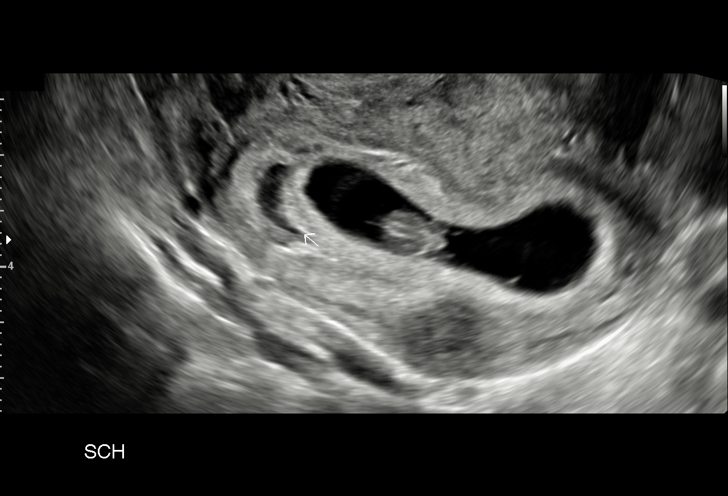
[im 57/62]
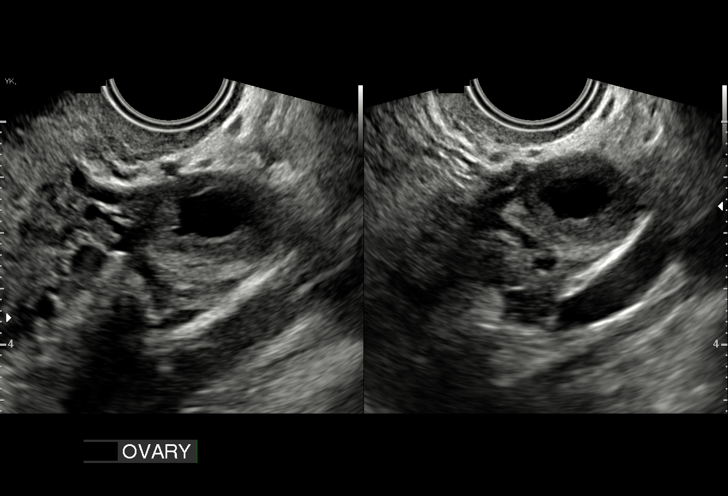
[im 62/62]
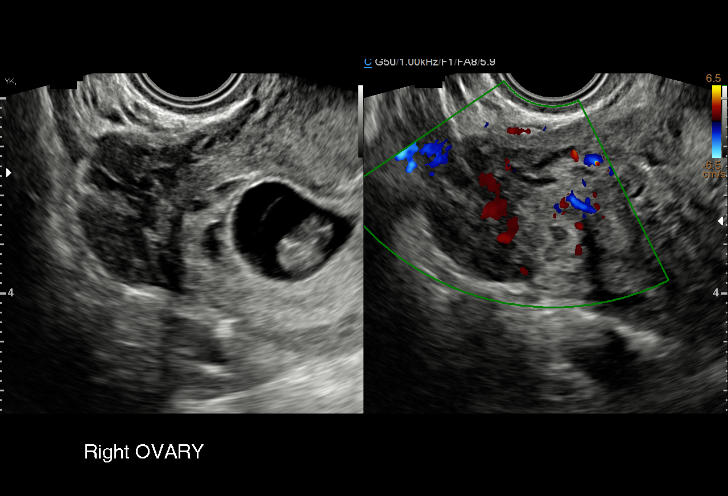

[15 of 28 positions shown; findings below may reference images not displayed]

FINDINGS: Intrauterine gestational sac: Single

Yolk sac:  Visualized.

Fetus: Visualized.

Fetal Cardiac Activity: Visualized.

Fetal Heart Rate: 169 bpm

CRL:  20.3 mm   8 w   4 d                  US EDC: 01/21/2020

Subchorionic hemorrhage: Small perigestational bleed involving less
than 30% of the gestational sac circumference.

Maternal uterus/adnexae: Intramural 2.5 x 1.9 x 2.1 cm posterior
right uterine body fibroid. Intramural 1.7 x 1.1 x 2.0 cm midline
posterior uterine body fibroid. No abnormal free fluid. Left ovary
measures 3.5 x 2.3 x 2.6 cm and contains a corpus luteum. Right
ovary measures 3.0 x 1.7 x 1.5 cm. No abnormal ovarian or adnexal
masses.
IMPRESSION: 1. Single living intrauterine gestation at 8 weeks 4 days by
crown-rump length, concordant with provided menstrual dating.
2. Small perigestational bleed.  Normal fetal cardiac activity.
3. Posterior uterine fibroids as detailed.
4. Normal ovaries.  No adnexal masses.  No abnormal free fluid.

## 2020-12-30 IMAGING — US US MFM FETAL BPP W/ NON-STRESS
1 series · 12 of 28 positions shown · non-contrast
Comparison: none

[Series 1: us mfm fetal bpp w/ non-stress · 30 acquisitions, 12 frames shown]
[im 2/30]
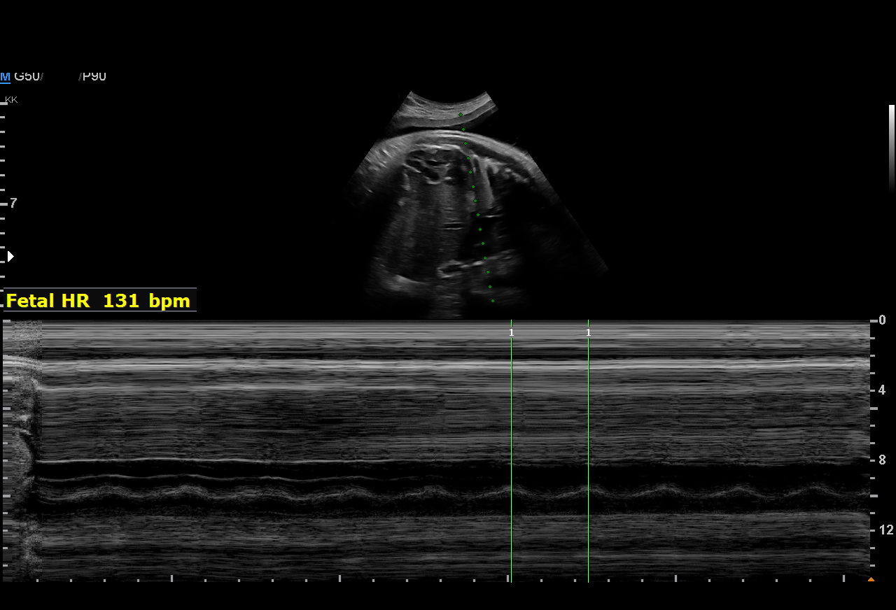
[im 4/30]
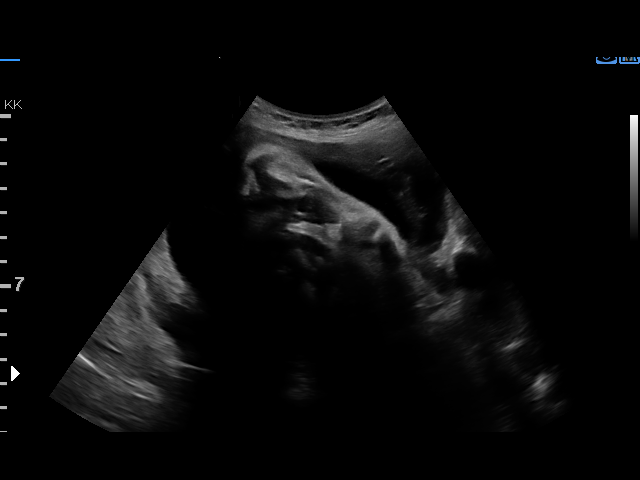
[im 6/30]
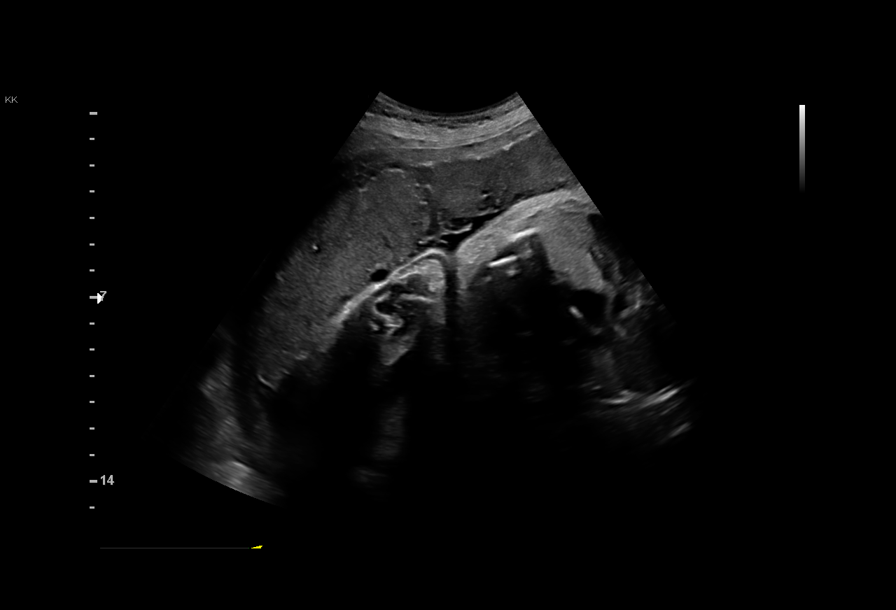
[im 9/30]
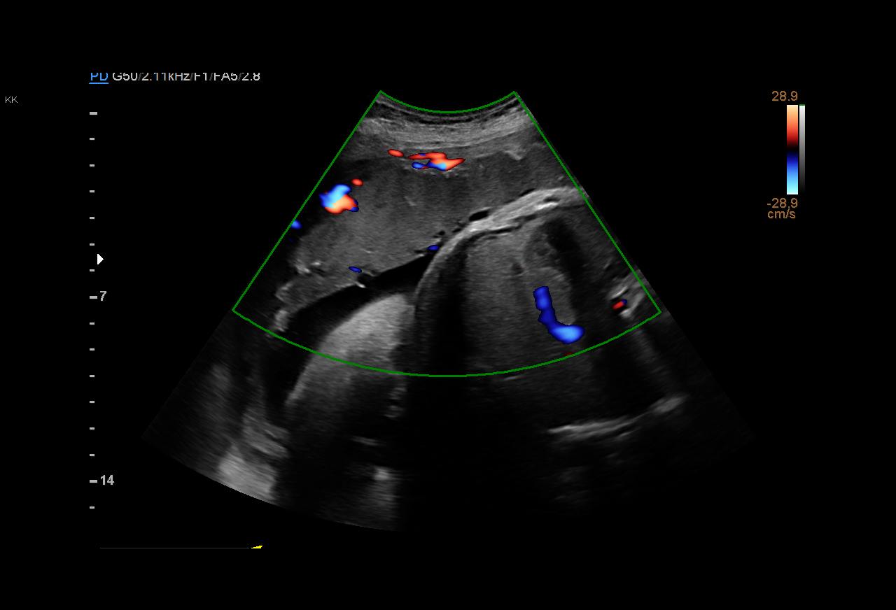
[im 11/30]
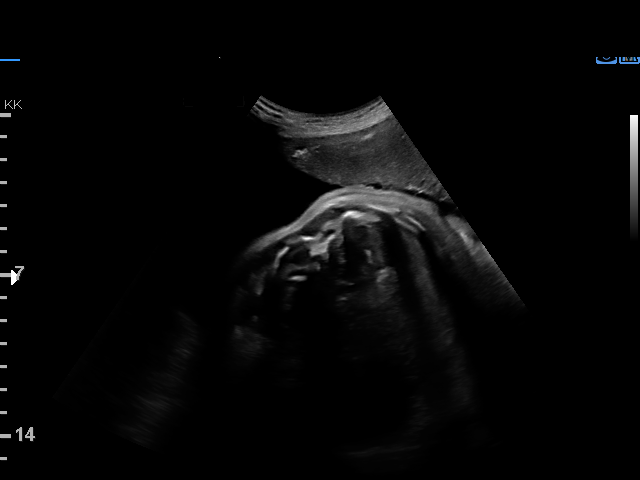
[im 13/30]
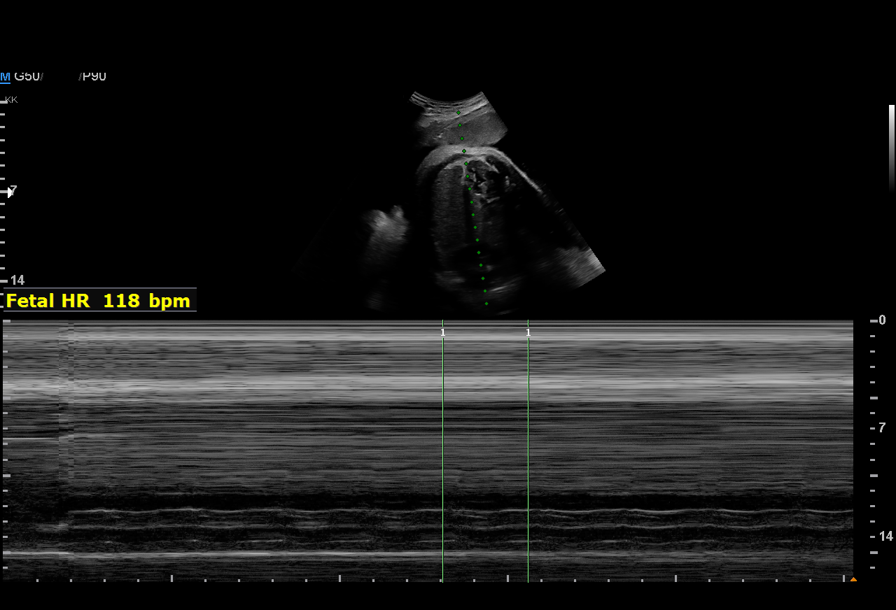
[im 17/30]
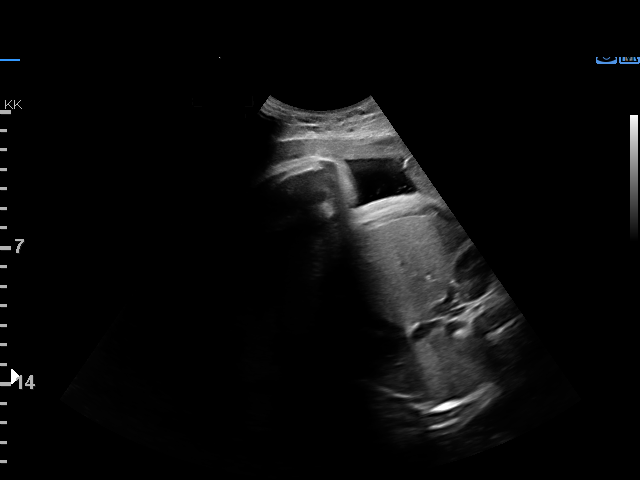
[im 19/30]
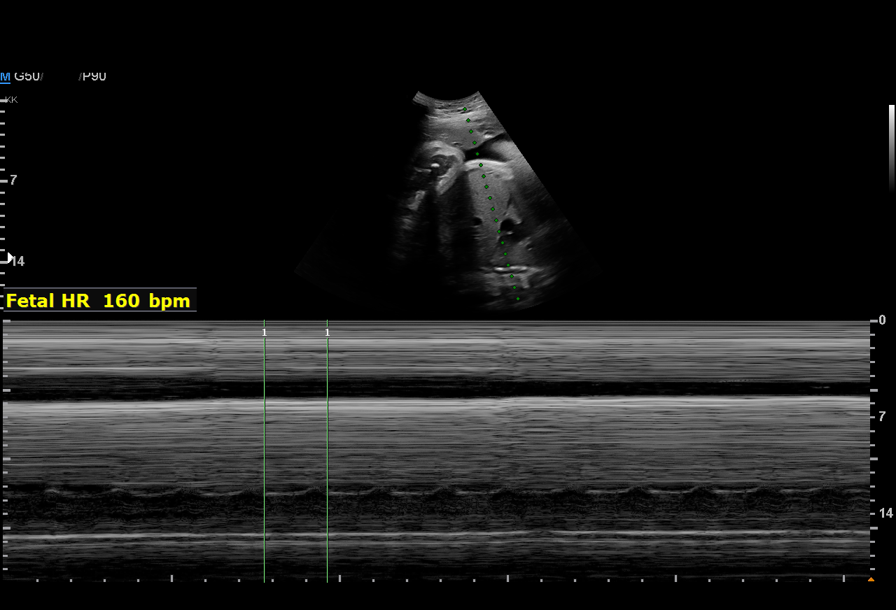
[im 21/30]
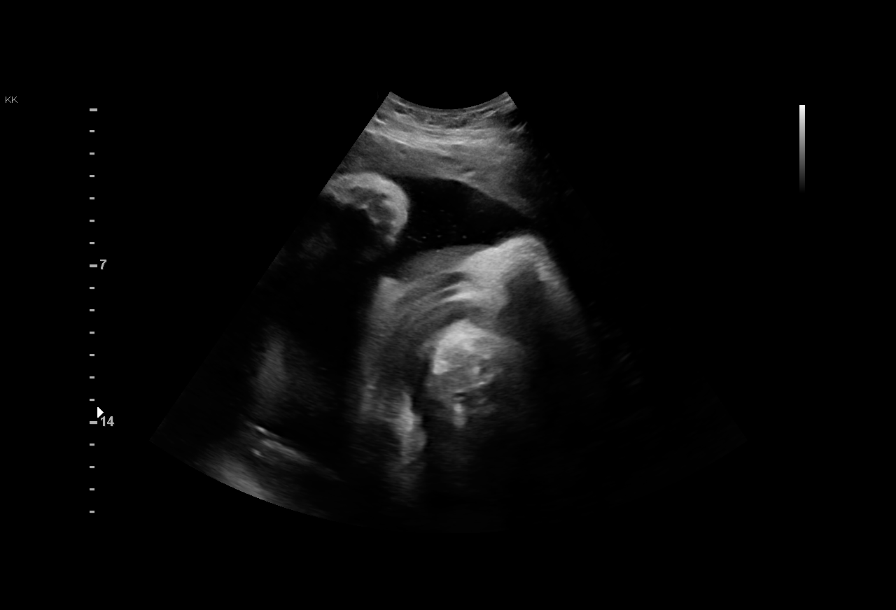
[im 24/30]
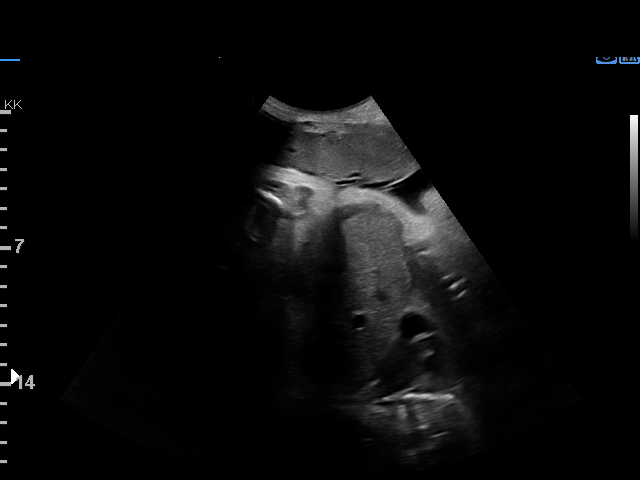
[im 26/30]
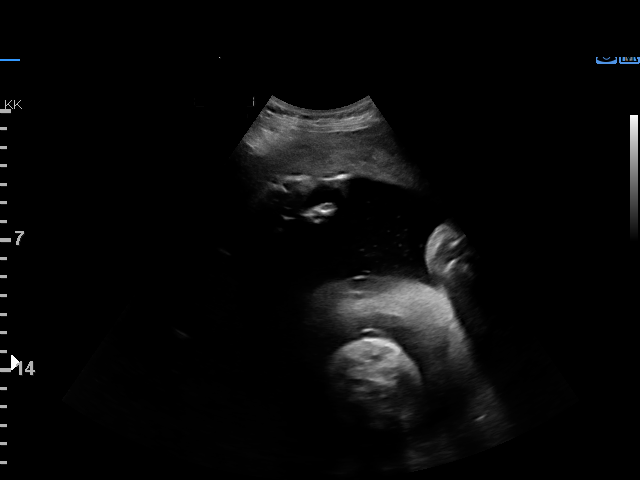
[im 28/30]
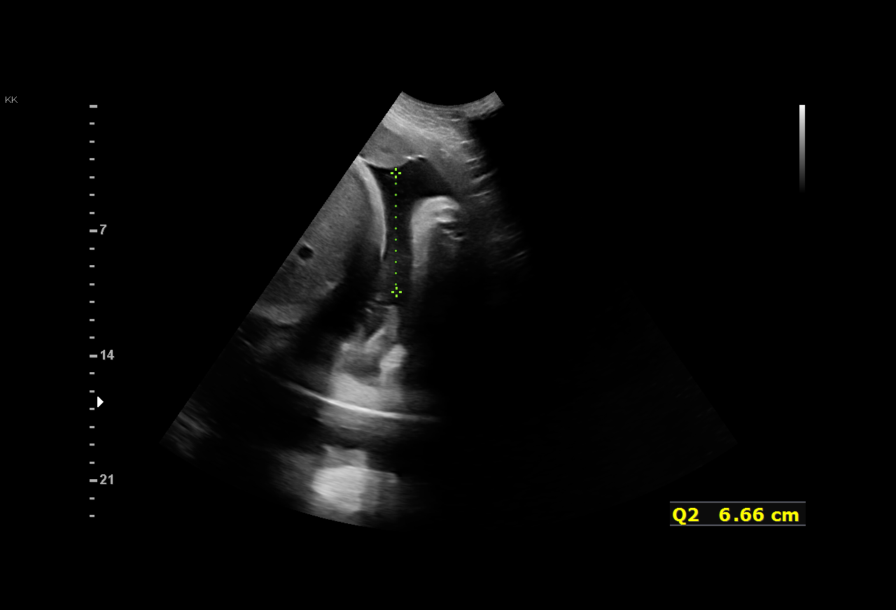

[12 of 28 positions shown; findings below may reference images not displayed]

W/NONSTRESS

Indications

 Decreased fetal movements, third trimester,
 unspecified
 Variable fetal heart rate decelerations,
 antepartum
 36 weeks gestation of pregnancy
Fetal Evaluation

 Num Of Fetuses:         1
 Fetal Heart Rate(bpm):  131
 Cardiac Activity:       Observed
 Presentation:           Cephalic
 Placenta:               Anterior
 P. Cord Insertion:      Not well visualized

 Amniotic Fluid
 AFI FV:      Within normal limits

 AFI Sum(cm)     %Tile       Largest Pocket(cm)
 12              38

 RUQ(cm)                     LUQ(cm)        LLQ(cm)

Biophysical Evaluation

 Amniotic F.V:   Pocket => 2 cm             F. Tone:        Observed
 F. Movement:    Observed                   Score:          [DATE]
 F. Breathing:   Observed
OB History

 Gravidity:    4         Term:   1        Prem:   0        SAB:   1
 TOP:          0       Ectopic:  1        Living: 1
Gestational Age

 Clinical EDD:  36w 4d                                        EDD:   01/22/20
 Best:          36w 4d     Det. By:  Clinical EDD             EDD:   01/22/20
Cervix Uterus Adnexa

 Cervix
 Not visualized (advanced GA >52wks)
Impression

 Patient is being evaluated at the [facility] for complaints of
 decreased fetal movements.  BPP was requested because of
 variable decelerations seen on NST.

 Amniotic fluid is normal and good fetal activity seen.
 Antenatal testing is reassuring.  BPP [DATE].  Cephalic
 presentation.
 Of note, loops of umbilical cord or seen around fetal neck.

 Cord around the neck in itself is not an indication for
 emergency delivery.  Patient is being admitted for inpatient
 observation by her provider.
Recommendations

 -If decreased fetal movements persist I recommend patient
 return for BPP and MCA Doppler tomorrow.
                 Lemaitre, Gonzy
